# Patient Record
Sex: Male | Born: 1965 | Hispanic: Yes | Marital: Married | Smoking: Current every day smoker
Health system: Southern US, Community
[De-identification: ages and names within clinical notes are randomized; demographics above are authoritative.]

## PROBLEM LIST (undated history)

## (undated) DIAGNOSIS — M1990 Unspecified osteoarthritis, unspecified site: Secondary | ICD-10-CM

## (undated) DIAGNOSIS — I10 Essential (primary) hypertension: Secondary | ICD-10-CM

## (undated) HISTORY — PX: WISDOM TOOTH EXTRACTION: SHX21

---

## 2011-05-23 ENCOUNTER — Ambulatory Visit: Payer: Self-pay | Admitting: Internal Medicine

## 2011-06-19 ENCOUNTER — Ambulatory Visit: Payer: Self-pay | Admitting: Internal Medicine

## 2011-06-19 DIAGNOSIS — Z0289 Encounter for other administrative examinations: Secondary | ICD-10-CM

## 2013-05-12 ENCOUNTER — Encounter (HOSPITAL_COMMUNITY): Payer: Self-pay | Admitting: Emergency Medicine

## 2013-05-12 ENCOUNTER — Emergency Department (HOSPITAL_COMMUNITY)
Admission: EM | Admit: 2013-05-12 | Discharge: 2013-05-12 | Disposition: A | Discharge status: Home / Self Care | Payer: Self-pay | Attending: Emergency Medicine | Admitting: Emergency Medicine

## 2013-05-12 ENCOUNTER — Emergency Department (HOSPITAL_COMMUNITY): Payer: Self-pay

## 2013-05-12 DIAGNOSIS — Z791 Long term (current) use of non-steroidal anti-inflammatories (NSAID): Secondary | ICD-10-CM | POA: Insufficient documentation

## 2013-05-12 DIAGNOSIS — M25559 Pain in unspecified hip: Secondary | ICD-10-CM | POA: Insufficient documentation

## 2013-05-12 DIAGNOSIS — M25551 Pain in right hip: Secondary | ICD-10-CM

## 2013-05-12 DIAGNOSIS — Z87828 Personal history of other (healed) physical injury and trauma: Secondary | ICD-10-CM | POA: Insufficient documentation

## 2013-05-12 MED ORDER — KETOROLAC TROMETHAMINE 60 MG/2ML IM SOLN
60.0000 mg | Freq: Once | INTRAMUSCULAR | Status: AC
  Administered 2013-05-12: 60 mg via INTRAMUSCULAR
  Filled 2013-05-12: qty 2

## 2013-05-12 MED ORDER — HYDROCODONE-ACETAMINOPHEN 5-325 MG PO TABS: 2.0000 | ORAL_TABLET | Freq: Four times a day (QID) | ORAL | Status: DC | PRN

## 2013-05-12 NOTE — ED Notes (Signed)
Pt. C/o of intermittent right sided groin pain for x2 months. Pt. States that when he has pain he cannot use right leg.

## 2013-05-12 NOTE — ED Notes (Addendum)
Pt. C/o intermittent right sided groin pain. States sometimes radiates from right flank to right groin. Reports right sided leg weakness when having pain. Pt. Unable to hold leg off of the bed. Strength equal in bilateral legs with flexion and extension. No swelling to area of pain noted

## 2013-05-12 NOTE — Discharge Instructions (Signed)
Avascular Necrosis Avascular necrosis is a disease resulting from the temporary or permanent loss of the blood supply to the bones. Without blood, the bone tissue dies and causes the bone to become soft. If the process involves the bone near a joint, it may lead to collapse of the joint surface. This disease is also known as:  Osteonecrosis.  Aseptic necrosis.  Ischemic bone necrosis. Avascular necrosis most commonly affects the ends (epiphysis) of long bones. The femur, the bone extending from the knee joint to the hip joint, is the bone most commonly involved. The disease may affect 1 bone, more than 1 bone at the same time, more than 1 bone at different times. It affects men and women equally. Avascular necrosis occurs at any age. But it is more common between the ages of 56 and 47 years. SYMPTOMS  In early stages patients may not have any symptoms. But as the disease progresses, joint pain generally develops. At first there is pain when putting weight on the affected joint, and then when resting. Pain usually develops gradually. It may be mild or severe. As the disease progresses and the bone and surrounding joint surface collapses, pain may develop or increase dramatically. Pain may be severe enough to limit range of motion in the affected joint. The period of time between the first symptoms and loss of joint function is different for each patient. This can range from several months to more than a year. Disability depends on:  What part of the bone is affected.  How large an area is involved.  How effectively the bone repairs itself.  If other illnesses are present.  If you are being treated for cancer with medications (chemotherapy).  Radiation.  The cause of the avascular necrosis. DIAGNOSIS  The diagnosis of aseptic necrosis is usually made by:  Taking a history.  Doing an exam.  Taking X-rays. (If X-rays are normal, an MRI may be required.)  Sometimes further blood work and  specialized studies may be necessary. TREATMENT  Treatment for this disease is necessary to maintain joint function. If untreated, most patients will suffer severe pain and limitation in movement within 2 years. Several treatments are available that help prevent further bone and joint damage. They can also reduce pain. To determine the most appropriate treatment, the caregiver considers the following aspects of a patient's disease:  The age of the patient.  The stage of the disease (early or late).  The location and amount of bone affected. It may be a small or large area.  The underlying cause of avascular necrosis. The goals in treatment are to:  Improve the patient's use of the affected joint.  Stop further damage to the bone.  Improve bone and joint survival. Your caregiver may use one or more of the following treatments:  Reduced weight bearing. If avascular necrosis is diagnosed early, the caregiver may begin treatment by having the patient limit weight on the affected joint. The caregiver may recommend limiting activities or using crutches. In some cases, reduced weight bearing can slow the damage caused by the disease and permit natural healing. When combined with medication to reduce pain, reduced weight bearing can be an effective way to avoid or delay surgery for some patients. Most patients eventually will need surgery to reconstruct the joint.  Core decompression. Core decompression works best in people who are in the earliest stages of avascular necrosis, before the collapse of the joint. This procedure often can reduce pain and slow the progression  of bone and joint destruction in these patients. This surgical procedure removes the inner layer of bone, which:  Reduces pressure within the bone.  Increases blood flow to the bone.  Allows more blood vessels to form.  Reduces pain.  Osteotomy. This surgical procedure re-shapes the bone to reduce stress on the affected area  of the joint. There is a lengthy recovery period. The patient's activities are very limited for 3 to 12 months after an osteotomy. This procedure is most effective for younger patients with advanced avascular necrosis, and those with a large area of affected bone.  Bone Graft. A bone graft may be used to support a joint after core decompression. Bone grafting is surgery that transplants healthy bone from one part of the patient, such as the leg, to the diseased area. Sometimes the bone is taken with it's blood vessels which are attached to local blood vessels near the area of bone collapse. This is called a vascularized bone graft. There is a lengthy recovery period after a bone graft, usually from 6 to 12 months. This procedure is technically complex.  Arthroplasty. Arthroplasty is also known as total joint replacement. Total joint replacement is used in late-stage avascular necrosis, and when the joint is deformed. In this surgery, the diseased joint is replaced with artificial parts. It may be recommended for people who are not good candidates for other treatments, such as patients who may not do well with repeated attempts to preserve the joint. Various types of replacements are available, and patients should discuss specific needs with their caregiver. New treatments being tried include:  The use of medications.  Electrical stimulation.  Combination therapies to increase the growth of new bone and blood vessels. Document Released: 08/25/2001 Document Revised: 05/28/2011 Document Reviewed: 10/26/2008 [facility] Patient Information 2014 [employer] [place], [place].   Follow-up with Orthopedic Dr. Shelle IronBeane Take pain medicine as prescribed

## 2013-05-12 NOTE — ED Provider Notes (Signed)
CSN: [id number]     Arrival date & time 05/12/13  1445 History   First MD Initiated Contact with Patient 05/12/13 1542     Chief Complaint  Patient presents with  . Groin Pain     (Consider location/radiation/quality/duration/timing/severity/associated sxs/prior Treatment) Patient is a 48 y.o. male presenting with groin pain and hip pain. The history is provided by the patient. No language interpreter was used.  Groin Pain This is a recurrent problem. Associated symptoms include arthralgias. Pertinent negatives include no chills, fever, nausea, rash, vomiting or weakness.  Hip Pain This is a recurrent problem. Associated symptoms include arthralgias. Pertinent negatives include no chills, fever, nausea, rash, vomiting or weakness.  Pt is a 48 year old male who presents with right hip pain that radiates into his groin. He reports that he has a history of an injury to that area and has had shots for it. He denies fever or recent illness. No reported N/V/D, rash or other joint pain. He denies any discomfort or pain in his testicles or penis. He reports that he is urinating and having bowel movements without any difficulty.   History reviewed. No pertinent past medical history. History reviewed. No pertinent past surgical history. No family history on file. History  Substance Use Topics  . Smoking status: Not on file  . Smokeless tobacco: Not on file  . Alcohol Use: Not on file    Review of Systems  Constitutional: Negative for fever and chills.  Gastrointestinal: Negative for nausea and vomiting.  Musculoskeletal: Positive for arthralgias.  Skin: Negative for rash.  Neurological: Negative for weakness.      Allergies  Review of patient's allergies indicates no known allergies.  Home Medications   Current Outpatient Rx  Name  Route  Sig  Dispense  Refill  . naproxen sodium (ANAPROX) 220 MG tablet   Oral   Take 220 mg by mouth once.         Marland Kitchen HYDROcodone-acetaminophen  (NORCO/VICODIN) 5-325 MG per tablet   Oral   Take 2 tablets by mouth every 6 (six) hours as needed.   15 tablet   0    BP 140/90  Pulse 61  Temp(Src) 98.2 F (36.8 C) (Oral)  Resp 18  SpO2 97% Physical Exam  Nursing note and vitals reviewed. Constitutional: He is oriented to person, place, and time. He appears well-developed and well-nourished.  HENT:  Head: Normocephalic and atraumatic.  Eyes: Conjunctivae are normal.  Neck: Normal range of motion. Neck supple. No JVD present. No tracheal deviation present. No thyromegaly present.  Cardiovascular: Normal rate, regular rhythm and normal heart sounds.   Pulmonary/Chest: Effort normal and breath sounds normal. No respiratory distress. He has no wheezes.  Abdominal: Hernia confirmed negative in the right inguinal area.  Genitourinary: Testes normal and penis normal.  Musculoskeletal:       Right hip: He exhibits tenderness. He exhibits no crepitus and no deformity.  No numbness or tingling. 2+ distal pulses, capillary refill<3secs. Good strength, coordination and sensation.   Lymphadenopathy:       Right: No inguinal adenopathy present.  Neurological: He is alert and oriented to person, place, and time.  Skin: Skin is warm and dry. No rash noted. No erythema.  Psychiatric: He has a normal mood and affect. His behavior is normal. Judgment and thought content normal.    ED Course  Procedures (including critical care time) Labs Review Labs Reviewed - No data to display Imaging Review Dg Hip Complete Right  05/12/2013   CLINICAL DATA:  Right proximal femoral pain. Right lateral groin pain. Injury 3 months ago.  EXAM: RIGHT HIP - COMPLETE 2+ VIEW  COMPARISON:  None.  FINDINGS: Greater than expected heterogeneity of density within both femoral heads, raising the possibility of avascular necrosis. No flattening or fracture observed. There is mild axial loss of articular space which is degenerative. Mild spurring of the femoral heads  noted.  IMPRESSION: 1. Heterogeneous density along the femoral heads, suspicious for avascular necrosis. Consider MRI of the hips for further characterization. No overt flattening of the cortical margin. 2. Degenerative findings with mild femoral head spurring and mild axial loss of articular space in both hips.   Electronically Signed   By: Herbie BaltimoreWalt  Liebkemann M.D.   On: 05/12/2013 18:30    EKG Interpretation   None       MDM   Final diagnoses:  Hip pain, right    Right hip pain with pain radiating into his groin. No pain or edema in testicles. Reports difficulty walking due to hip pain. No numbness or tingling, good sensation, strength and 2+ distal pulses. Right hip x-ray; density along the femoral heads suspicious for avascular necrosis. Pt given Toradol injection here with some relief and prescription for hydrocodone. Follow-up with Ortho ASAP. Discussed plan with pt and he agrees. Return precautions given.      Irish EldersKelly Trever Streater, NP 05/14/13 1226

## 2013-05-15 NOTE — ED Provider Notes (Signed)
Medical screening examination/treatment/procedure(s) were performed by non-physician practitioner and as supervising physician I was immediately available for consultation/collaboration.  EKG Interpretation   None         Sharry Beining B. Cadyn Fann, MD 05/15/13 0705 

## 2013-09-21 ENCOUNTER — Ambulatory Visit (HOSPITAL_COMMUNITY)
Admission: RE | Admit: 2013-09-21 | Discharge: 2013-09-21 | Disposition: A | Discharge status: Home / Self Care | Payer: No Typology Code available for payment source | Source: Ambulatory Visit | Attending: Internal Medicine | Admitting: Internal Medicine

## 2013-09-21 ENCOUNTER — Encounter (INDEPENDENT_AMBULATORY_CARE_PROVIDER_SITE_OTHER): Payer: Self-pay

## 2013-09-21 ENCOUNTER — Other Ambulatory Visit (HOSPITAL_COMMUNITY): Payer: Self-pay | Admitting: Internal Medicine

## 2013-09-21 DIAGNOSIS — R52 Pain, unspecified: Secondary | ICD-10-CM

## 2013-09-21 DIAGNOSIS — M5030 Other cervical disc degeneration, unspecified cervical region: Secondary | ICD-10-CM | POA: Insufficient documentation

## 2013-09-21 DIAGNOSIS — M25519 Pain in unspecified shoulder: Secondary | ICD-10-CM | POA: Insufficient documentation

## 2016-06-18 ENCOUNTER — Ambulatory Visit (HOSPITAL_BASED_OUTPATIENT_CLINIC_OR_DEPARTMENT_OTHER)
Admission: RE | Admit: 2016-06-18 | Discharge: 2016-06-18 | Disposition: A | Discharge status: Home / Self Care | Payer: BLUE CROSS/BLUE SHIELD | Source: Ambulatory Visit | Attending: Medical | Admitting: Medical

## 2016-06-18 ENCOUNTER — Encounter: Payer: Self-pay | Admitting: Medical

## 2016-06-18 ENCOUNTER — Ambulatory Visit (INDEPENDENT_AMBULATORY_CARE_PROVIDER_SITE_OTHER): Payer: BLUE CROSS/BLUE SHIELD | Admitting: Medical

## 2016-06-18 VITALS — BP 148/80 | HR 52 | Temp 98.3°F | Resp 16 | Ht 69.0 in | Wt 150.2 lb

## 2016-06-18 DIAGNOSIS — M25551 Pain in right hip: Secondary | ICD-10-CM | POA: Diagnosis not present

## 2016-06-18 DIAGNOSIS — M898X5 Other specified disorders of bone, thigh: Secondary | ICD-10-CM | POA: Insufficient documentation

## 2016-06-18 DIAGNOSIS — M65861 Other synovitis and tenosynovitis, right lower leg: Secondary | ICD-10-CM | POA: Insufficient documentation

## 2016-06-18 DIAGNOSIS — G8929 Other chronic pain: Secondary | ICD-10-CM

## 2016-06-18 DIAGNOSIS — M25561 Pain in right knee: Secondary | ICD-10-CM

## 2016-06-18 DIAGNOSIS — R030 Elevated blood-pressure reading, without diagnosis of hypertension: Secondary | ICD-10-CM | POA: Diagnosis not present

## 2016-06-18 LAB — COMPREHENSIVE METABOLIC PANEL
ALT: 20 U/L (ref 0–53)
AST: 21 U/L (ref 0–37)
Albumin: 4.4 g/dL (ref 3.5–5.2)
Alkaline Phosphatase: 53 U/L (ref 39–117)
BUN: 22 mg/dL (ref 6–23)
CALCIUM: 9.5 mg/dL (ref 8.4–10.5)
CO2: 31 meq/L (ref 19–32)
CREATININE: 0.98 mg/dL (ref 0.40–1.50)
Chloride: 106 mEq/L (ref 96–112)
GFR: 85.82 mL/min (ref >60.00)
Glucose, Bld: 95 mg/dL (ref 70–99)
Potassium: 4.2 mEq/L (ref 3.5–5.1)
Sodium: 140 mEq/L (ref 135–145)
Total Bilirubin: 0.3 mg/dL (ref 0.2–1.2)
Total Protein: 7.1 g/dL (ref 6.0–8.3)

## 2016-06-18 MED ORDER — TRAMADOL HCL 50 MG PO TABS: 50.0000 mg | ORAL_TABLET | Freq: Three times a day (TID) | ORAL | 0 refills | Status: DC | PRN

## 2016-06-18 NOTE — Progress Notes (Signed)
Pre visit review using our clinic review tool, if applicable. No additional management support is needed unless otherwise documented below in the visit note. 

## 2016-06-18 NOTE — Progress Notes (Signed)
Subjective:    Patient ID: Trevor Mitchell, male    DOB: 05-Mar-1966, 51 y.o.   MRN: [id number]  HPI   New pt   I have reviewed pt PMH, PSH, FH, Social History and Surgical History  Pt in for first time here with me. Pt states last time was seeing MD in [place]. Pt was evaluated in ED for rt hip pain about 3 years ago per pt.. Pt states had xray. Pt state had injection into hip area. Pt understand that he has decreased joint space. Pt still has pain. In past told needs surgery. Pt states daily pain for 3 years.  Pt not on any med presently for pain.  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 Married with 3 children.   Review of Systems  Constitutional: Negative for chills, fatigue and fever.  Respiratory: Negative for cough, chest tightness, shortness of breath and wheezing.   Cardiovascular: Negative for chest pain and palpitations.  Gastrointestinal: Negative for abdominal pain, constipation, diarrhea and nausea.       Rare gerd.  Genitourinary: Negative for difficulty urinating, enuresis, flank pain, frequency and genital sores.  Musculoskeletal:       Rt hip pain. Some rt knee pain as well.  Skin: Negative for rash.  Neurological: Negative for dizziness and headaches.  Hematological: Negative for adenopathy. Does not bruise/bleed easily.  Psychiatric/Behavioral: Negative for behavioral problems, confusion and hallucinations. The patient is not nervous/anxious.     History reviewed. No pertinent past medical history.   Social History   Social History  . Marital status: Married    Spouse name: N/A  . Number of children: N/A  . Years of education: N/A   Occupational History  . Not on file.   Social History Main Topics  . Smoking status: Current Every Day Smoker    Packs/day: 0.50    Years: 35.00    Types: Cigarettes  . Smokeless tobacco: Never Used     Comment: 8-10 cigarettes daily  .  Alcohol use Yes     Comment: 1 liter per month / 48 oz beer every 2 weeks.  . Drug use: No     Comment: previous cocaine user.   Marland Kitchen Sexual activity: Yes   Other Topics Concern  . Not on file   Social History Narrative  . No narrative on file    History reviewed. No pertinent surgical history.  Family History  Problem Relation Age of Onset  . Depression Mother   . Asthma Sister   . Hypertension Sister   . Other Maternal Uncle     enlarged heart    No Known Allergies  No current outpatient prescriptions on file prior to visit.   No current facility-administered medications on file prior to visit.     BP (!) 150/89 (BP Location: Left Arm, Cuff Size: Normal)   Pulse (!) 52   Temp 98.3 F (36.8 C) (Oral)   Resp 16   Ht  (1.753 m)   Wt 150 lb 3.2 oz (68.1 kg)   SpO2 100% Comment: room air  BMI 22.18 kg/m       Objective:   Physical Exam   General Mental Status- Alert. General Appearance- Not in acute distress.   Skin General: Color- Normal Color. Moisture- Normal Moisture.  Neck Carotid Arteries- Normal color. Moisture- Normal Moisture. No carotid bruits. No JVD.  Chest and Lung Exam  Auscultation: Breath Sounds:-Normal.  Cardiovascular Auscultation:Rythm- Regular. Murmurs & Other Heart Sounds:Auscultation of the heart reveals- No Murmurs.  Abdomen Inspection:-Inspeection Normal. Palpation/Percussion:Note:No mass. Palpation and Percussion of the abdomen reveal- Non Tender, Non Distended + BS, no rebound or guarding.    Neurologic Cranial Nerve exam:- CN III-XII intact(No nystagmus), symmetric smile. Strength:- 5/5 equal and symmetric strength both upper and lower extremities.  Rt hip- pain in rt hip on mild range of motion movement. Rt knee- mild pain on range of motion of knee.    Assessment & Plan:  For your rt hip pain will rx tramadol to use but only for severe pain as explained. Will get hip xray today and refer you to  orthopedist.  For knee pain will get xray. This pain is likely associated with hip pain.  For your blood presure I want you to get bp over the counter cuff. Start checking you bp daily. Record reading and on follow up may need to prescribe medication for high blood pressure.  Please get cmp today.  Follow up in 2 weeks or as needed  Jeania Nater, Ramon Dredge, [employer]

## 2016-06-18 NOTE — Patient Instructions (Signed)
For your rt hip pain will rx tramadol to use but only for severe pain as explained. Will get hip xray today and refer you to orthopedist.  For knee pain will get xray. This pain is likely associated with hip pain.  For your blood presure I want you to get bp over the counter cuff. Start checking you bp daily. Record reading and on follow up may need to prescribe medication for high blood pressure.  Please get cmp today.  Follow up in 2 weeks or as needed

## 2016-06-19 ENCOUNTER — Telehealth: Payer: Self-pay | Admitting: Medical

## 2016-06-19 NOTE — Telephone Encounter (Signed)
Pt was informed and understood,pt will be waiting to be referred and set up his appt with the specialist.

## 2016-06-19 NOTE — Telephone Encounter (Signed)
LVM for pt to return call to inform him about his results.

## 2016-06-19 NOTE — Telephone Encounter (Signed)
Referral sent to [facility], awaiting appt

## 2016-06-19 NOTE — Telephone Encounter (Signed)
-----   Message from Esperanza Richters, PA-C sent at 06/18/2016  8:40 PM EDT ----- Pt has flattening of femoral head. 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 Will refer to orthopedist. Will you tell Trevor Mitchell that we want to expidite the referral to orthopedist. Will you notify pt.

## 2016-06-19 NOTE — Telephone Encounter (Signed)
-----   Message from Esperanza Richters, PA-C sent at 06/18/2016  8:43 PM EDT ----- Pt kidney function looks good. Normal liver enzymes.

## 2016-07-02 ENCOUNTER — Ambulatory Visit: Payer: BLUE CROSS/BLUE SHIELD | Admitting: Medical

## 2016-07-18 ENCOUNTER — Ambulatory Visit (INDEPENDENT_AMBULATORY_CARE_PROVIDER_SITE_OTHER): Payer: BLUE CROSS/BLUE SHIELD | Admitting: Medical

## 2016-07-18 ENCOUNTER — Encounter: Payer: Self-pay | Admitting: Medical

## 2016-07-18 VITALS — BP 133/86 | HR 61 | Temp 98.1°F | Resp 16 | Ht 69.0 in | Wt 146.2 lb

## 2016-07-18 DIAGNOSIS — G8929 Other chronic pain: Secondary | ICD-10-CM

## 2016-07-18 DIAGNOSIS — R030 Elevated blood-pressure reading, without diagnosis of hypertension: Secondary | ICD-10-CM

## 2016-07-18 DIAGNOSIS — M25551 Pain in right hip: Secondary | ICD-10-CM | POA: Diagnosis not present

## 2016-07-18 DIAGNOSIS — M25561 Pain in right knee: Secondary | ICD-10-CM

## 2016-07-18 NOTE — Progress Notes (Signed)
Subjective:    Patient ID: Trevor Mitchell, male    DOB: 09-03-1965, 51 y.o.   MRN: [id number]  HPI  Pt in for bp check.  Pt states he just got bp machine yesterday. He has not checked yet. He did not have batteries so has to buy.   Pt made aware want to see less than 140/90.   Pt pain in hip and knee  is about the same.He never got tramadol. He got call from ortho but misunderstood the caller and he never made appointment.     Review of Systems  Constitutional: Negative for activity change, diaphoresis, fatigue and fever.  Respiratory: Negative for cough, choking, shortness of breath and wheezing.   Cardiovascular: Negative for chest pain and palpitations.  Gastrointestinal: Negative for abdominal pain, nausea and vomiting.  Musculoskeletal:       Rt hip and knee pain.  Skin: Negative for rash.  Neurological: Negative for dizziness, seizures, speech difficulty, weakness, light-headedness and headaches.  Hematological: Negative for adenopathy. Does not bruise/bleed easily.  Psychiatric/Behavioral: Negative for behavioral problems and confusion.   No past medical history on file.   Social History   Social History  . Marital status: Married    Spouse name: N/A  . Number of children: N/A  . Years of education: N/A   Occupational History  . Not on file.   Social History Main Topics  . Smoking status: Current Every Day Smoker    Packs/day: 0.50    Years: 35.00    Types: Cigarettes  . Smokeless tobacco: Never Used     Comment: 8-10 cigarettes daily  . Alcohol use Yes     Comment: 1 liter per month / 48 oz beer every 2 weeks.  . Drug use: No     Comment: previous cocaine user.   Marland Kitchen Sexual activity: Yes   Other Topics Concern  . Not on file   Social History Narrative  . No narrative on file    No past surgical history on file.  Family History  Problem Relation Age of Onset  . Depression Mother   . Asthma Sister   . Hypertension Sister   .  Other Maternal Uncle     enlarged heart    No Known Allergies  Current Outpatient Prescriptions on File Prior to Visit  Medication Sig Dispense Refill  . traMADol (ULTRAM) 50 MG tablet Take 1 tablet (50 mg total) by mouth every 8 (eight) hours as needed. (Patient not taking: Reported on 07/18/2016) 16 tablet 0   No current facility-administered medications on file prior to visit.     BP 133/86 (BP Location: Left Arm, Patient Position: Sitting, Cuff Size: Normal)   Pulse 61   Temp 98.1 F (36.7 C) (Oral)   Resp 16   Ht  (1.753 m)   Wt 146 lb 3.2 oz (66.3 kg)   SpO2 100%   BMI 21.59 kg/m       Objective:   Physical Exam  Mental Status- Alert. General Appearance- Not in acute distress.   Skin General: Color- Normal Color. Moisture- Normal Moisture.  Neck Carotid Arteries- Normal color. Moisture- Normal Moisture. No carotid bruits. No JVD.  Chest and Lung Exam Auscultation: Breath Sounds:-Normal.  Cardiovascular Auscultation:Rythm- Regular. Murmurs & Other Heart Sounds:Auscultation of the heart reveals- No Murmurs.    Neurologic Cranial Nerve exam:- CN III-XII intact(No nystagmus), symmetric smile. Strength:- 5/5 equal and symmetric strength both upper and lower extremities.  Rt hip- pain in  rt hip on mild range of motion movement. Rt knee- mild pain on range of motion of knee.      Assessment & Plan:  Now that you have your bp cuff check bp daily for one week. Write number down. Want to confirm less than 140/90. If high then will rx med for bp.  For your hip pain and knee pain will ask Victorino Dike to call ortho again. Arrange your appointment and get Royal Hawthorn to call you..  Follow up date will be determined after I review your bp readings.  Remember you can take tramadol for pain. But avoid any nsaids as they can increase your bp and we are trying to determine if bp med needed.  Shahira Fiske, Ramon Dredge, PA-C

## 2016-07-18 NOTE — Progress Notes (Signed)
Pre visit review using our clinic review tool, if applicable. No additional management support is needed unless otherwise documented below in the visit note. 

## 2016-07-18 NOTE — Telephone Encounter (Signed)
Will you call and schedule his ortho appointment. Spanish speaker and he got call but did not undertand. Schedule any day but may 18,2018. Then let jackelyn call and give pt date.

## 2016-07-18 NOTE — Telephone Encounter (Signed)
Called pt and informed the below but pt will call back to get the complete information about the address and the tel.

## 2016-07-18 NOTE — Telephone Encounter (Signed)
Message sent to [facility], awaiting appt, then once given will have Annice Pih call patient

## 2016-07-18 NOTE — Patient Instructions (Signed)
Now that you have your bp cuff check bp daily for one week. Write number down. Want to confirm less than 140/90. If high then will rx med for bp.  For your hip pain and knee pain will ask Victorino Dike to call ortho again. Arrange your appointment and get Royal Hawthorn to call you..  Follow up date will be determined after I review your bp readings.  Remember you can take tramadol for pain. But avoid any nsaids as they can increase your bp and we are trying to determine if bp med needed.

## 2016-07-18 NOTE — Telephone Encounter (Signed)
Dr WhitfCleophas Dunker/4/18 @ [address] [facility] [address], [address] [place], [place] [address] [phone]  Annice Pih can you make patient aware?

## 2016-07-20 ENCOUNTER — Ambulatory Visit (INDEPENDENT_AMBULATORY_CARE_PROVIDER_SITE_OTHER): Payer: BLUE CROSS/BLUE SHIELD | Admitting: Orthopaedic Surgery

## 2016-07-20 ENCOUNTER — Telehealth: Payer: Self-pay | Admitting: Medical

## 2016-07-20 ENCOUNTER — Encounter (INDEPENDENT_AMBULATORY_CARE_PROVIDER_SITE_OTHER): Payer: Self-pay | Admitting: Orthopaedic Surgery

## 2016-07-20 VITALS — BP 146/85 | HR 72 | Resp 14 | Ht 69.0 in | Wt 150.0 lb

## 2016-07-20 DIAGNOSIS — M25551 Pain in right hip: Secondary | ICD-10-CM

## 2016-07-20 DIAGNOSIS — R9389 Abnormal findings on diagnostic imaging of other specified body structures: Secondary | ICD-10-CM

## 2016-07-20 NOTE — Telephone Encounter (Signed)
Pt dropped off document to be filled out PCP ([facility] Document needs to be faxed asap when document ready to [phone] [facility]. Pt would like to be informed when document fax to orthopedics. Document put at front office tray.

## 2016-07-20 NOTE — Progress Notes (Signed)
Office Visit Note   Patient: Trevor Mitchell           Date of Birth: 03/09/1966           MRN: [id number] Visit Date: 07/20/2016              Requested by: Esperanza RichtersEdward Saguier, PA-C [address] [place], [place] [id number] PCP: Esperanza RichtersSaguier, Edward, PA-C   Assessment & Plan: Visit Diagnoses:  1. Pain of right hip joint   Avascular necrosis right hip with collapse  Plan: Long discussion regarding pathology and definitive treatment of total hip replacement. He actually has some minor changes of avascular necrosis on the opposite hip which is essentially asymptomatic. I have discussed the surgery or hospitalization time out of work rehabilitation and potential for pain relief. He like to proceed..Discussion approximately 40-45 minutes regarding the pathology and treatment options and specifically surgery and what was involved.  Follow-Up Instructions: Return will schedule right hip replacement.   Orders:  No orders of the defined types were placed in this encounter.  No orders of the defined types were placed in this encounter.     Procedures: No procedures performed   Clinical Data: No additional findings. Films of the pelvis and right hip were reviewed on the PACS system from April 2. He has considerable avascular necrosis of his right hip with multiple cysts and collapse of the head. Necrotic areas exist to the head neck junction. Avascular necrotic changes are also seen in the left hip but without collapse.  Subjective: Chief Complaint  Patient presents with  . Right Hip - Pain    Mr. Trevor Mitchell is a 51 y o that presents with right hip and knee pain x 3 yrs. 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 XR obtained at PCP. Groin pain and patella pain. Limited ROM in both.  Several year history of progressive right hip pain. Mr. Trevor Mitchell relates that he was a heavy drinker several years ago after the death of his mother. He was "drinking quite heavily" with  rum and other alcoholic beverages. He denies any history of injury or trauma. He's drinking very "little at present and smokes up to 8 cigarettes a day. His pain is progressively noted in the right groin with referred pain to his right hip. He denies any back pain. Denies numbness or tingling.  HPI  Review of Systems   Objective: Vital Signs: BP (!) 146/85   Pulse 72   Resp 14   Ht 5\' 9"  (1.753 m)   Wt 150 lb (68 kg)   BMI 22.15 kg/m   Physical Exam  Ortho Exam thin gentleman in no acute distress. Comfortable on the examining table. Considerable pain and loss of motion of right hip with internal and external rotation. Leg lengths appear to be symmetrical. Neurovascular exam intact. Painless range of motion of left hip. No percussible tenderness of lumbar spine.  Specialty Comments:  No specialty comments available.  Imaging: No results found.   PMFS History: There are no active problems to display for this patient.  History reviewed. No pertinent past medical history.  Family History  Problem Relation Age of Onset  . Depression Mother   . Asthma Sister   . Hypertension Sister   . Other Maternal Uncle     enlarged heart    History reviewed. No pertinent surgical history. Social History   Occupational History  . Not on file.   Social History Main  Topics  . Smoking status: Current Every Day Smoker    Packs/day: 0.50    Years: 35.00    Types: Cigarettes  . Smokeless tobacco: Never Used     Comment: 8-10 cigarettes daily  . Alcohol use Yes     Comment: 1 liter per month / 48 oz beer every 2 weeks.  . Drug use: No     Comment: previous cocaine user.   Marland Kitchen Sexual activity: Yes     Valeria Batman, MD   Note - This record has been created using [employer].  Chart creation errors have been sought, but may not always  have been located. Such creation errors do not reflect on  the standard of medical care.

## 2016-07-27 NOTE — Telephone Encounter (Addendum)
I did not see the form. But he has needs preop evaluation. I need to get ekg. Can he be scheduled next week.   Need form to fill out day he is in.

## 2016-07-27 NOTE — Telephone Encounter (Signed)
Jackie--please call patient and schedule.

## 2016-07-27 NOTE — Telephone Encounter (Signed)
Form received.    Last OV:  07/20/16 Labs: CMET on 06/18/16 No recent EKG.  Please advise on whether an office visit for surgical clearance needed.

## 2016-07-30 NOTE — Telephone Encounter (Signed)
Called pt and scheduled an appt for this Friday 08-03-2016 OV. Done.

## 2016-08-01 NOTE — Telephone Encounter (Signed)
Noted.  Form forwarded to Salina AprilJasmine Torrence, CMA until appt.

## 2016-08-03 ENCOUNTER — Encounter: Payer: Self-pay | Admitting: Medical

## 2016-08-03 ENCOUNTER — Ambulatory Visit (INDEPENDENT_AMBULATORY_CARE_PROVIDER_SITE_OTHER): Payer: BLUE CROSS/BLUE SHIELD | Admitting: Medical

## 2016-08-03 VITALS — BP 140/80 | HR 65 | Temp 98.3°F | Resp 16 | Ht 69.0 in | Wt 143.6 lb

## 2016-08-03 DIAGNOSIS — I10 Essential (primary) hypertension: Secondary | ICD-10-CM

## 2016-08-03 DIAGNOSIS — Z01818 Encounter for other preprocedural examination: Secondary | ICD-10-CM | POA: Diagnosis not present

## 2016-08-03 NOTE — Patient Instructions (Addendum)
For your pre-op evaluation we did ekg. The ekg looks good. Only slight slow heart rate. This appears due to rest. On activity normal increase.  I do want to get cxr within next week. Also cbc and cmp before filling out your pre-op form.  I do recommend you stop smoking prior to surgery as smoking can delay wound healing.  Please check your bp daily over next week and call our office. You can give readings to DominicaJackelyn spanish speaking staff. If borderline or elevated then would recommend med for bp as high bp around time of surgery might lead to cancellation.  After reviewing xray, labs and bp levels will fill out your pre-op form  Follow up date to be determined after review of studies.

## 2016-08-03 NOTE — Progress Notes (Addendum)
Subjective:    Patient ID: Trevor Mitchell, male    DOB: 21-Mar-1965, 51 y.o.   MRN: [id number]  HPI  Pt in for evaluation prior to surgery. Pt has rt hip pain that is severe. Told needs surgery.  1. Heterogeneous density along the femoral heads, suspicious for avascular necrosis. Consider MRI of the hips for further characterization. No overt flattening of the cortical margin. 2. Degenerative findings with mild femoral head spurring and mild axial loss of articular space in both hips.    Pt sent her for pre op evaluation/clearance  Pt had never had surgery. Never had general anesthesia. No family history of family members with history of allergies to anesthesia or problems. Pt never had  hx of syncope or cardiac arrhythmia. No hx of bleeding disorder. Pt does smoke. 8 cigarettes a day. No cardiac or respiratory symptoms No sob, no wheezing. No current illness No chronic bronchitis. Pt told/advised to stop smoking by surgeon.  Pt does have recent high blood pressure. He has machine now at home but has not been checking his blood pressure. Plans to check this week.  Pt declines offer for med to stop smoking.  Review of Systems  Constitutional: Negative for chills, fatigue and fever.  Respiratory: Negative for cough, chest tightness, shortness of breath and wheezing.   Cardiovascular: Negative for chest pain and palpitations.  Gastrointestinal: Negative for abdominal pain, blood in stool, nausea and vomiting.  Genitourinary: Negative for dysuria and flank pain.  Musculoskeletal: Negative for back pain and myalgias.       Rt hip pain.  Skin: Negative for rash.  Neurological: Negative for dizziness and headaches.  Hematological: Negative for adenopathy. Does not bruise/bleed easily.  Psychiatric/Behavioral: Negative for behavioral problems and confusion. The patient is not nervous/anxious.    No past medical history on file.   Social History   Social History  .  Marital status: Married    Spouse name: N/A  . Number of children: N/A  . Years of education: N/A   Occupational History  . Not on file.   Social History Main Topics  . Smoking status: Current Every Day Smoker    Packs/day: 0.50    Years: 35.00    Types: Cigarettes  . Smokeless tobacco: Never Used     Comment: 8-10 cigarettes daily  . Alcohol use Yes     Comment: 1 liter per month / 48 oz beer every 2 weeks.  . Drug use: No     Comment: previous cocaine user.   Marland Kitchen Sexual activity: Yes   Other Topics Concern  . Not on file   Social History Narrative  . No narrative on file    No past surgical history on file.  Family History  Problem Relation Age of Onset  . Depression Mother   . Asthma Sister   . Hypertension Sister   . Other Maternal Uncle        enlarged heart    No Known Allergies  Current Outpatient Prescriptions on File Prior to Visit  Medication Sig Dispense Refill  . traMADol (ULTRAM) 50 MG tablet Take 1 tablet (50 mg total) by mouth every 8 (eight) hours as needed. (Patient not taking: Reported on 07/18/2016) 16 tablet 0   No current facility-administered medications on file prior to visit.     BP (!) 155/83 (BP Location: Right Arm, Patient Position: Sitting, Cuff Size: Normal)   Pulse 60   Temp 98.3 F (36.8 C) (Oral)  Resp 16   Ht 5\' 9"  (1.753 m)   Wt 143 lb 9.6 oz (65.1 kg)   SpO2 99%   BMI 21.21 kg/m       Objective:   Physical Exam  General Mental Status- Alert. General Appearance- Not in acute distress.   Skin General: Color- Normal Color. Moisture- Normal Moisture.  Neck Carotid Arteries- Normal color. Moisture- Normal Moisture. No carotid bruits. No JVD.  Chest and Lung Exam Auscultation: Breath Sounds:-Normal.  Cardiovascular Auscultation:Rythm- Regular. Murmurs & Other Heart Sounds:Auscultation of the heart reveals- No Murmurs.  Abdomen Inspection:-Inspeection Normal. Palpation/Percussion:Note:No mass. Palpation and  Percussion of the abdomen reveal- Non Tender, Non Distended + BS, no rebound or guarding.    Neurologic Cranial Nerve exam:- CN III-XII intact(No nystagmus), symmetric smile. Strength:- 5/5 equal and symmetric strength both upper and lower extremities.  Rt hip- pain on range of motion. Walks with limp.    Assessment & Plan:  Sinus bradycardia on review of xray.  For your pre-op evaluation we did ekg. The ekg looks good. Only slight slow heart rate. This appears due to rest. On activity normal increase to 60's.  I do want to get cxr within next week. Also cbc and cmp before filling out your pre-op form.  I do recommend you stop smoking prior to surgery as smoking can delay wound healing.  Please check your bp daily over next week and call our office. You can give readings to DominicaJackelyn spanish speaking staff. If borderline or elevated then would recommend med for bp as high bp around time of surgery might lead to cancellation.  After reviewing xray, labs and bp levels will fill out your pre-op form  Follow up date to be determined after review of studies.  Neema Fluegge, Ramon DredgeEdward, PA-C

## 2016-08-06 ENCOUNTER — Ambulatory Visit (INDEPENDENT_AMBULATORY_CARE_PROVIDER_SITE_OTHER): Payer: Self-pay | Admitting: Orthopaedic Surgery

## 2016-08-10 ENCOUNTER — Ambulatory Visit (HOSPITAL_BASED_OUTPATIENT_CLINIC_OR_DEPARTMENT_OTHER)
Admission: RE | Admit: 2016-08-10 | Discharge: 2016-08-10 | Disposition: A | Discharge status: Home / Self Care | Payer: BLUE CROSS/BLUE SHIELD | Source: Ambulatory Visit | Attending: Medical | Admitting: Medical

## 2016-08-10 ENCOUNTER — Other Ambulatory Visit (INDEPENDENT_AMBULATORY_CARE_PROVIDER_SITE_OTHER): Payer: BLUE CROSS/BLUE SHIELD

## 2016-08-10 DIAGNOSIS — Z01818 Encounter for other preprocedural examination: Secondary | ICD-10-CM | POA: Insufficient documentation

## 2016-08-10 DIAGNOSIS — R918 Other nonspecific abnormal finding of lung field: Secondary | ICD-10-CM | POA: Insufficient documentation

## 2016-08-10 DIAGNOSIS — I10 Essential (primary) hypertension: Secondary | ICD-10-CM | POA: Diagnosis not present

## 2016-08-10 LAB — COMPREHENSIVE METABOLIC PANEL
ALK PHOS: 54 U/L (ref 39–117)
ALT: 13 U/L (ref 0–53)
AST: 17 U/L (ref 0–37)
Albumin: 4.3 g/dL (ref 3.5–5.2)
BUN: 16 mg/dL (ref 6–23)
CO2: 26 mEq/L (ref 19–32)
CREATININE: 1.16 mg/dL (ref 0.40–1.50)
Calcium: 9.3 mg/dL (ref 8.4–10.5)
Chloride: 106 mEq/L (ref 96–112)
GFR: 70.6 mL/min (ref >60.00)
GLUCOSE: 107 mg/dL — AB (ref 70–99)
Potassium: 3.7 mEq/L (ref 3.5–5.1)
Sodium: 139 mEq/L (ref 135–145)
TOTAL PROTEIN: 6.8 g/dL (ref 6.0–8.3)
Total Bilirubin: 0.4 mg/dL (ref 0.2–1.2)

## 2016-08-10 LAB — CBC WITH DIFFERENTIAL/PLATELET
Basophils Absolute: 0.1 10*3/uL (ref 0.0–0.1)
Basophils Relative: 1.4 % (ref 0.0–3.0)
EOS ABS: 0.1 10*3/uL (ref 0.0–0.7)
Eosinophils Relative: 1.8 % (ref 0.0–5.0)
HEMATOCRIT: 41.7 % (ref 39.0–52.0)
Hemoglobin: 13.8 g/dL (ref 13.0–17.0)
LYMPHS PCT: 29.9 % (ref 12.0–46.0)
Lymphs Abs: 2.2 10*3/uL (ref 0.7–4.0)
MCHC: 33.2 g/dL (ref 30.0–36.0)
MCV: 92.2 fl (ref 78.0–100.0)
MONOS PCT: 10.7 % (ref 3.0–12.0)
Monocytes Absolute: 0.8 10*3/uL (ref 0.1–1.0)
NEUTROS ABS: 4.2 10*3/uL (ref 1.4–7.7)
Neutrophils Relative %: 56.2 % (ref 43.0–77.0)
Platelets: 260 10*3/uL (ref 150.0–400.0)
RBC: 4.52 Mil/uL (ref 4.22–5.81)
RDW: 14.1 % (ref 11.5–15.5)
WBC: 7.5 10*3/uL (ref 4.0–10.5)

## 2016-08-11 NOTE — Telephone Encounter (Addendum)
Pt chest xray showed nodule at base of lung verses shadow. Radiologist recommends repeat chest xray. This is very important to get repeat. If not shadow then would need ct of chest. Very important since he has a long history of smoking. Pt is spanish speaker  Would you let me know that you advised pt and document he was advised. Already placed the order.

## 2016-08-14 ENCOUNTER — Telehealth: Payer: Self-pay | Admitting: Medical

## 2016-08-14 NOTE — Telephone Encounter (Signed)
Pt was called and was informed the below. Pt understood and will come this wk to have xrays done.

## 2016-08-14 NOTE — Telephone Encounter (Signed)
-----   Message from Esperanza RichtersEdward Saguier, PA-C sent at 08/11/2016  9:06 AM EDT ----- Pt chest xray showed nodule at base of lung verses shadow. Radiologist recommends repeat chest xray. This is very important to get repeat. If not shadow then would need ct of chest. Very important since he has a long history of smoking. Pt is spanish speaker.  Morrie Sheldonshley I am sending this to you in case Jaceklin not working on Tuesday. If she is not working would you let me know. Then I woud notify him. Otherwise will let Jackelyn notify him.

## 2016-08-15 ENCOUNTER — Telehealth: Payer: Self-pay | Admitting: Medical

## 2016-08-15 NOTE — Telephone Encounter (Signed)
Pt was informed about his lab results today, pt understood and is awaiting to be informed when to pick up his documents that need to be filled out (pre-op forms)

## 2016-08-15 NOTE — Telephone Encounter (Signed)
I do have labs, ekg and cxr. I was in process of filling out his physical exam/preop form but reviewed my last  note. I had asked him to check his bp daily and let me know results as his bp was high and I wanted to know trend of bp before surgery. If daily high was going to write bp med. Will you schedule him nurse bp check on Friday. Also does he know he is supposed to repeat his chest xray to evaluate for possible nodule. Day he comes in for nurse bp check I want to be updated on reading. He needs to come in this week for nurse bp reading as past Tuesday next week I will be on vacation.  On Friday if blood pressure in good range will be able to fill out paperwork.

## 2016-08-16 NOTE — Telephone Encounter (Signed)
Pt was informed and schedule for tomorrow for bp check at 3:30, pt also will bring in bp reading, so that he can have his paperwork filled out.

## 2016-08-17 ENCOUNTER — Ambulatory Visit (HOSPITAL_BASED_OUTPATIENT_CLINIC_OR_DEPARTMENT_OTHER)
Admission: RE | Admit: 2016-08-17 | Discharge: 2016-08-17 | Disposition: A | Discharge status: Home / Self Care | Payer: BLUE CROSS/BLUE SHIELD | Source: Ambulatory Visit | Attending: Medical | Admitting: Medical

## 2016-08-17 ENCOUNTER — Ambulatory Visit (INDEPENDENT_AMBULATORY_CARE_PROVIDER_SITE_OTHER): Payer: BLUE CROSS/BLUE SHIELD | Admitting: Medical

## 2016-08-17 VITALS — BP 148/90 | HR 60

## 2016-08-17 DIAGNOSIS — R030 Elevated blood-pressure reading, without diagnosis of hypertension: Secondary | ICD-10-CM | POA: Diagnosis not present

## 2016-08-17 DIAGNOSIS — R9389 Abnormal findings on diagnostic imaging of other specified body structures: Secondary | ICD-10-CM

## 2016-08-17 DIAGNOSIS — R938 Abnormal findings on diagnostic imaging of other specified body structures: Secondary | ICD-10-CM | POA: Diagnosis not present

## 2016-08-17 MED ORDER — LOSARTAN POTASSIUM 100 MG PO TABS: 100.0000 mg | ORAL_TABLET | Freq: Every day | ORAL | 0 refills | Status: DC

## 2016-08-17 NOTE — Progress Notes (Signed)
Pre visit review using our clinic review tool, if applicable. No additional management support is needed unless otherwise documented below in the visit note.  Patient presents in office for blood pressure check per telephone note 08/15/16.   He brought in a log of his readings for the past week:  Sunday  144/85 Monday  129/81 Tuesday  137/83 Wednesday  134/92 Thursday  134/82  Currently, the patient does not take any medications for blood pressure. Today's readings were as follow: BP 147/86 P 62 & BP 148/90 P 60. Patient is asymptomatic.  Per Esperanza RichtersEdward Saguier, PA-C: START Losartan (Cozaar)100 MG once daily. Return in 2 weeks for a nurse visit to have blood pressure rechecked.  Informed patient of the provider's recommendations. He verbalized understanding and did not have any further questions or concerns before leaving the office.  On last  2 visits pt systolic  140 and 146. Pt has upcoming surgery and some concern that if bp high day of visit they might cancel. So he will start losartan and follow up nurse bp check in 2 wks.   Saguier, Ramon DredgeEdward, PA-C

## 2016-08-17 NOTE — Patient Instructions (Addendum)
Per Esperanza RichtersEdward Saguier, PA-C: START Losartan (Cozaar)100 MG once daily. Return in 2 weeks for a nurse visit to have blood pressure rechecked.

## 2016-08-20 ENCOUNTER — Telehealth (INDEPENDENT_AMBULATORY_CARE_PROVIDER_SITE_OTHER): Payer: Self-pay | Admitting: Orthopaedic Surgery

## 2016-08-20 NOTE — Telephone Encounter (Signed)
LVM with pt to call to schedule surgery. Will try pt again at a later time. 

## 2016-08-21 ENCOUNTER — Telehealth: Payer: Self-pay | Admitting: Medical

## 2016-08-21 NOTE — Telephone Encounter (Signed)
Pt was informed about his rx result, pt understood and ok with it.

## 2016-08-27 ENCOUNTER — Encounter (INDEPENDENT_AMBULATORY_CARE_PROVIDER_SITE_OTHER): Payer: Self-pay | Admitting: Orthopaedic Surgery

## 2016-08-30 ENCOUNTER — Other Ambulatory Visit (INDEPENDENT_AMBULATORY_CARE_PROVIDER_SITE_OTHER): Payer: Self-pay | Admitting: Orthopaedic Surgery

## 2016-09-13 ENCOUNTER — Ambulatory Visit (INDEPENDENT_AMBULATORY_CARE_PROVIDER_SITE_OTHER): Payer: BLUE CROSS/BLUE SHIELD | Admitting: Medical

## 2016-09-13 ENCOUNTER — Encounter: Payer: Self-pay | Admitting: Medical

## 2016-09-13 VITALS — BP 118/73 | HR 64 | Temp 98.1°F | Resp 16 | Ht 69.0 in | Wt 142.6 lb

## 2016-09-13 DIAGNOSIS — M25551 Pain in right hip: Secondary | ICD-10-CM

## 2016-09-13 DIAGNOSIS — I10 Essential (primary) hypertension: Secondary | ICD-10-CM | POA: Diagnosis not present

## 2016-09-13 NOTE — Progress Notes (Signed)
Subjective:    Patient ID: Trevor Mitchell, male    DOB: 10/06/1965, 51 y.o.   MRN: [id number]  HPI  Pt in for follow up.   He is still has hip pain. Pt surgery is pending until August. Pt is using tramadol but very rare. He has about 15 tabs left.  Pt has been checking his bp daily now and most of his time his bp is 130/80. Sometimes his bp is 120/80 with out meds and he does not take medications. Pt expresses concern for low blood pressure. But aware in past in our office bp was high.    Review of Systems  Constitutional: Negative for chills, fatigue and fever.  Respiratory: Negative for cough, chest tightness, shortness of breath and wheezing.   Cardiovascular: Negative for chest pain and palpitations.  Gastrointestinal: Negative for abdominal pain.  Musculoskeletal:       Rt hp pain.  Neurological: Negative for dizziness, syncope, weakness, numbness and headaches.  Hematological: Negative for adenopathy. Does not bruise/bleed easily.  Psychiatric/Behavioral: Negative for behavioral problems, confusion and dysphoric mood.   No past medical history on file.   Social History   Social History  . Marital status: Married    Spouse name: N/A  . Number of children: N/A  . Years of education: N/A   Occupational History  . Not on file.   Social History Main Topics  . Smoking status: Current Every Day Smoker    Packs/day: 0.50    Years: 35.00    Types: Cigarettes  . Smokeless tobacco: Never Used     Comment: 8-10 cigarettes daily  . Alcohol use Yes     Comment: 1 liter per month / 48 oz beer every 2 weeks.  . Drug use: No     Comment: previous cocaine user.   Marland Kitchen Sexual activity: Yes   Other Topics Concern  . Not on file   Social History Narrative  . No narrative on file    No past surgical history on file.  Family History  Problem Relation Age of Onset  . Depression Mother   . Asthma Sister   . Hypertension Sister   . Other Maternal Uncle    enlarged heart    No Known Allergies  Current Outpatient Prescriptions on File Prior to Visit  Medication Sig Dispense Refill  . losartan (COZAAR) 100 MG tablet Take 1 tablet (100 mg total) by mouth daily. 30 tablet 0  . traMADol (ULTRAM) 50 MG tablet Take 1 tablet (50 mg total) by mouth every 8 (eight) hours as needed. (Patient not taking: Reported on 07/18/2016) 16 tablet 0   No current facility-administered medications on file prior to visit.     BP 118/73 (BP Location: Left Arm, Patient Position: Sitting, Cuff Size: Normal)   Pulse 64   Temp 98.1 F (36.7 C) (Oral)   Resp 16   Ht 5\' 9"  (1.753 m)   Wt 142 lb 9.6 oz (64.7 kg)   SpO2 99%   BMI 21.06 kg/m       Objective:   Physical Exam  General Mental Status- Alert. General Appearance- Not in acute distress.   Skin General: Color- Normal Color. Moisture- Normal Moisture.  Neck Carotid Arteries- Normal color. Moisture- Normal Moisture. No carotid bruits. No JVD.  Chest and Lung Exam Auscultation: Breath Sounds:-Normal.  Cardiovascular Auscultation:Rythm- Regular. Murmurs & Other Heart Sounds:Auscultation of the heart reveals- No Murmurs.  Abdomen Inspection:-Inspeection Normal. Palpation/Percussion:Note:No mass. Palpation and Percussion of the  abdomen reveal- Non Tender, Non Distended + BS, no rebound or guarding.    Neurologic Cranial Nerve exam:- CN III-XII intact(No nystagmus), symmetric smile. Strength:- 5/5 equal and symmetric strength both upper and lower extremities.      Assessment & Plan:  For hip pain continue tramadol and when you run out of med can refill the med. Hopefully surgery will resolve pain in August.  For high bp continue losartan 100 mg a day. Check bp daily and when you run out med call and [facility]spanish speaker. Please give her your readings and I will review. I might decrease med to 50 mg after reviewing bp readings.  Follow up in 4-6 weeks or as needed

## 2016-09-13 NOTE — Patient Instructions (Addendum)
For hip pain continue tramadol and when you run out of med can refill the med. Hopefully surgery will resolve pain in August.  For high bp continue losartan 100 mg a day. Check bp daily and when you run out of med call and [facility] with [facility]spanish speaker. Please give her your readings and I will review. I might decrease med to 50 mg after reviewing bp readings.  Follow up in 4-6 weeks or as needed

## 2016-10-10 ENCOUNTER — Telehealth (INDEPENDENT_AMBULATORY_CARE_PROVIDER_SITE_OTHER): Payer: Self-pay | Admitting: Orthopedic Surgery

## 2016-10-10 ENCOUNTER — Ambulatory Visit (INDEPENDENT_AMBULATORY_CARE_PROVIDER_SITE_OTHER): Payer: BLUE CROSS/BLUE SHIELD | Admitting: Orthopedic Surgery

## 2016-10-10 NOTE — Telephone Encounter (Signed)
I have tried to contact Trevor Mitchell because of failure to show for his history and physical today 10/10/2016. Apparently his phone is not active and states that he cannot receive any phone calls. Also tried the number for the spouse and that number was incorrect according to the woman on the phone

## 2016-10-10 NOTE — Progress Notes (Deleted)
Mr. Trevor Mitchell did not show for his H&P for surgery in 2 weeks. I have called the phone number in the chart. States that he is not accepting any phone calls. I have also tried the phone number for his spouse and apparently that is not his spouse and she has no idea who he has.

## 2016-10-11 ENCOUNTER — Encounter (INDEPENDENT_AMBULATORY_CARE_PROVIDER_SITE_OTHER): Payer: Self-pay | Admitting: Orthopedic Surgery

## 2016-10-11 ENCOUNTER — Ambulatory Visit (INDEPENDENT_AMBULATORY_CARE_PROVIDER_SITE_OTHER): Payer: BLUE CROSS/BLUE SHIELD | Admitting: Orthopedic Surgery

## 2016-10-11 VITALS — BP 136/78 | HR 66 | Ht 70.0 in | Wt 136.0 lb

## 2016-10-11 DIAGNOSIS — M1611 Unilateral primary osteoarthritis, right hip: Secondary | ICD-10-CM

## 2016-10-11 NOTE — Pre-Procedure Instructions (Signed)
    [facility]  [facility]      [facility] [place], [place] - [address] [place] [address] Phone: [phone] Fax: [phone]  [facility] [address] - [place], [place] - [address] [place] [address] Phone: [phone] Fax: [phone]                                Instrucciones Para Antes de la Ciruga   Su ciruga est programada para-(your procedure is scheduled on) Tuesday, August 7th    [facility] - 8:00 AM (posted surgery time 10:08 am-12:39 pm)    Por favor llame al [phone] si tiene algn problema en la maana de la ciruga. (please call if you have any problems the morning of surgery.) or may call [phone]                   Recuerde:   No coma alimentos ni tome lquidos, incluyendo agua, despus de la medianoche del  (Do not eat food or drink liquids including water after midnight on Wednesday  4-5 days prior to surgery, STOP taking any Vitamins, Herbal Supplements, Anti=inflammatories   [place] estas medicinas en la maana de la ciruga con un SORBITO de agua (take these meds the morning of surgery with a SIP of water) nada   Puede cepillarse los dientes en la maana de la ciruga. (you may brush your teeth the morning of surgery)   No use joyas, maquillaje de ojos, lpiz labial, crema para el cuerpo o esmalte de uas oscuro. (Do not wear jewelry - no rings or watches.   No puede usar desodorante. (you may NOT wear deodorant)   Si va a ser ingresado despues de la ciruga, deje la maleta en el carro hasta que se le haya asignado una habitacin. (If you are to be admitted after surgery, leave suitcase in car until your room has been assigned.)   Firma del paciente (patient signature) ______________________________________    [facility] is not responsible for any belongings or valuable    Please read over the  following fact sheets that you were given. Pain Booklet, MRSA Information and Surgical Site Infection Prevention

## 2016-10-11 NOTE — H&P (Signed)
Norlene CampbellPeter Whitfield, MD   Jacqualine CodeBrian Nadiya Pieratt, PA-C [address], [place], [place]  [address]                             [phone]   Trevor HampshireJuan Rafael Rodriguez-Tejada MRN:  [id number] DOB/SEX:  07/22/1965/male  ORTHOPAEDIC HISTORY & PHYSICAL  CHIEF COMPLAINT:  Painful right Hip  HISTORY: Trevor HampshireJuan Rafael Rodriguez-Tejadais a 51 y.o. male  Who has a history of pain and functional disability in the right hip(s) due to arthritis and patient has failed non-surgical conservative treatments for greater than 12 weeks to include corticosteriod injections.  Onset of symptoms was gradual starting 3 years ago with gradually worsening course since that time. Patient currently rates pain in the right hip at 8 out of 10 with activity. Patient has night pain, worsening of pain with activity and weight bearing, trendelenberg gait, pain that interfers with activities of daily living and pain with passive range of motion. Patient has evidence of subchondral cysts, subchondral sclerosis, joint subluxation, joint space narrowing and Femoral head collapse by imaging studies. This condition presents safety issues increasing the risk of falls. This patient has avascular necrosis of the hip.  There is no current active infection.  Several year history of progressive right hip pain. Mr. [name] relates that he was a heavy drinker several years ago after the death of his mother. He was "drinking quite heavily" with rum and other alcoholic beverages. He denies any history of injury or trauma. He's drinking very "little at present and smokes up to 8 cigarettes a day. His pain is progressively noted in the right groin with referred pain to his right hip.    PAST MEDICAL HISTORY: There are no active problems to display for this patient.  History reviewed. No pertinent past medical history. History reviewed. No pertinent surgical history.   MEDICATIONS PRIOR TO ADMISSION:  Current Outpatient Prescriptions:  .  losartan (COZAAR)  100 MG tablet, Take 1 tablet (100 mg total) by mouth daily., Disp: 30 tablet, Rfl: 0 .  traMADol (ULTRAM) 50 MG tablet, Take 1 tablet (50 mg total) by mouth every 8 (eight) hours as needed., Disp: 16 tablet, Rfl: 0   ALLERGIES:  No Known Allergies  REVIEW OF SYSTEMS:  Review of Systems  Cardiovascular:       HYPERTENSION  All other systems reviewed and are negative.   FAMILY HISTORY:   Family History  Problem Relation Age of Onset  . High blood pressure Mother   . Diabetes Mother   . Asthma Sister   . Hypertension Sister   . Other Maternal Uncle        enlarged heart    SOCIAL HISTORY:   Social History   Occupational History  . Not on file.   Social History Main Topics  . Smoking status: Current Every Day Smoker    Packs/day: 0.50    Years: 35.00    Types: Cigarettes  . Smokeless tobacco: Never Used     Comment: 8-10 cigarettes daily  . Alcohol use Yes     Comment: 1 liter per month / 48 oz beer every 2 weeks.  . Drug use: No     Comment: previous cocaine user.   Marland Kitchen. Sexual activity: Yes     EXAMINATION:  Vital signs in last 24 hours: BP 136/78 (BP Location: Right Arm, Patient Position: Sitting, Cuff Size: Normal)   Pulse 66   Ht 5\' 10"  (1.778  m)   Wt 136 lb (61.7 kg)   BMI 19.51 kg/m   Physical Exam  Constitutional: He is oriented to person, place, and time. He appears well-developed and well-nourished.  HENT:  Head: Normocephalic and atraumatic.  Eyes: Pupils are equal, round, and reactive to light. EOM are normal.  Neck: Neck supple.  Cardiovascular: Normal rate, regular rhythm, normal heart sounds and intact distal pulses.   No murmur heard. Pulmonary/Chest: Effort normal and breath sounds normal.  Abdominal: Soft. Bowel sounds are normal. He exhibits no mass. There is no tenderness.  Neurological: He is alert and oriented to person, place, and time.  Skin: Skin is warm and dry.  Psychiatric: He has a normal mood and affect. His behavior is normal.  Judgment and thought content normal.   Ortho Exam  Imaging Review Plain radiographs demonstrate severe degenerative joint disease of the right hip. The bone quality appears to be good for age and reported activity level.  Assessment: End stage arthritis, right Hip  History reviewed. No pertinent past medical history.  Plan: for right total hip replacement.  The patient history, physical examination, clinical judgement of the provider and imaging studies are consistent with end stage degenerative joint disease of the right hip(s) and total hip arthroplasty is deemed medically necessary. The treatment options including medical management, injection therapy, arthroscopy and arthroplasty were discussed at length. The risks and benefits of total hip arthroplasty were presented and reviewed. The risks due to aseptic loosening, infection, stiffness, dislocation/subluxation,  thromboembolic complications and other imponderables were discussed.  The patient acknowledged the explanation, agreed to proceed with the plan. The clearance notes recently received were reviewed and concurs with proceeding then surgical intervention.  Patient is being admitted for inpatient treatment for surgery, pain control, PT, OT, prophylactic antibiotics, VTE prophylaxis, progressive ambulation and ADL's and discharge planning.The patient is planning to be discharged home with home health services   Oris DroneBrian D. Aleda Granaetrarca, PA-C [facility] [phone]  10/11/2016 1:17 PM

## 2016-10-11 NOTE — Progress Notes (Signed)
  Peter Whitfield, MD   Jordayn Mink, PA-C [address], [place], [place]  [address]                             [phone]   Trevor Mitchell MRN:  [id number] DOB/SEX:  07/20/1965/male  ORTHOPAEDIC HISTORY & PHYSICAL  CHIEF COMPLAINT:  Painful right Hip  HISTORY: Trevor Rafael Rodriguez-Tejadais a 50 y.o. male  Who has a history of pain and functional disability in the right hip(s) due to arthritis and patient has failed non-surgical conservative treatments for greater than 12 weeks to include corticosteriod injections.  Onset of symptoms was gradual starting 3 years ago with gradually worsening course since that time. Patient currently rates pain in the right hip at 8 out of 10 with activity. Patient has night pain, worsening of pain with activity and weight bearing, trendelenberg gait, pain that interfers with activities of daily living and pain with passive range of motion. Patient has evidence of subchondral cysts, subchondral sclerosis, joint subluxation, joint space narrowing and Femoral head collapse by imaging studies. This condition presents safety issues increasing the risk of falls. This patient has avascular necrosis of the hip.  There is no current active infection.  Several year history of progressive right hip pain. Mr. Tejada relates that he was a heavy drinker several years ago after the death of his mother. He was "drinking quite heavily" with rum and other alcoholic beverages. He denies any history of injury or trauma. He's drinking very "little at present and smokes up to 8 cigarettes a day. His pain is progressively noted in the right groin with referred pain to his right hip.    PAST MEDICAL HISTORY: There are no active problems to display for this patient.  History reviewed. No pertinent past medical history. History reviewed. No pertinent surgical history.   MEDICATIONS PRIOR TO ADMISSION:  Current Outpatient Prescriptions:  .  losartan (COZAAR)  100 MG tablet, Take 1 tablet (100 mg total) by mouth daily., Disp: 30 tablet, Rfl: 0 .  traMADol (ULTRAM) 50 MG tablet, Take 1 tablet (50 mg total) by mouth every 8 (eight) hours as needed., Disp: 16 tablet, Rfl: 0   ALLERGIES:  No Known Allergies  REVIEW OF SYSTEMS:  Review of Systems  Cardiovascular:       HYPERTENSION  All other systems reviewed and are negative.   FAMILY HISTORY:   Family History  Problem Relation Age of Onset  . High blood pressure Mother   . Diabetes Mother   . Asthma Sister   . Hypertension Sister   . Other Maternal Uncle        enlarged heart    SOCIAL HISTORY:   Social History   Occupational History  . Not on file.   Social History Main Topics  . Smoking status: Current Every Day Smoker    Packs/day: 0.50    Years: 35.00    Types: Cigarettes  . Smokeless tobacco: Never Used     Comment: 8-10 cigarettes daily  . Alcohol use Yes     Comment: 1 liter per month / 48 oz beer every 2 weeks.  . Drug use: No     Comment: previous cocaine user.   . Sexual activity: Yes     EXAMINATION:  Vital signs in last 24 hours: BP 136/78 (BP Location: Right Arm, Patient Position: Sitting, Cuff Size: Normal)   Pulse 66   Ht 5' 10" (1.778   m)   Wt 136 lb (61.7 kg)   BMI 19.51 kg/m   Physical Exam  Constitutional: He is oriented to person, place, and time. He appears well-developed and well-nourished.  HENT:  Head: Normocephalic and atraumatic.  Eyes: Pupils are equal, round, and reactive to light. EOM are normal.  Neck: Neck supple.  Cardiovascular: Normal rate, regular rhythm, normal heart sounds and intact distal pulses.   No murmur heard. Pulmonary/Chest: Effort normal and breath sounds normal.  Abdominal: Soft. Bowel sounds are normal. He exhibits no mass. There is no tenderness.  Neurological: He is alert and oriented to person, place, and time.  Skin: Skin is warm and dry.  Psychiatric: He has a normal mood and affect. His behavior is normal.  Judgment and thought content normal.   Ortho Exam  Imaging Review Plain radiographs demonstrate severe degenerative joint disease of the right hip. The bone quality appears to be good for age and reported activity level.  Assessment: End stage arthritis, right Hip  History reviewed. No pertinent past medical history.  Plan: for right total hip replacement.  The patient history, physical examination, clinical judgement of the provider and imaging studies are consistent with end stage degenerative joint disease of the right hip(s) and total hip arthroplasty is deemed medically necessary. The treatment options including medical management, injection therapy, arthroscopy and arthroplasty were discussed at length. The risks and benefits of total hip arthroplasty were presented and reviewed. The risks due to aseptic loosening, infection, stiffness, dislocation/subluxation,  thromboembolic complications and other imponderables were discussed.  The patient acknowledged the explanation, agreed to proceed with the plan. The clearance notes recently received were reviewed and concurs with proceeding then surgical intervention.  Patient is being admitted for inpatient treatment for surgery, pain control, PT, OT, prophylactic antibiotics, VTE prophylaxis, progressive ambulation and ADL's and discharge planning.The patient is planning to be discharged home with home health services   Shelton Soler D. England Greb, PA-C [facility] [phone]  10/11/2016 1:17 PM   

## 2016-10-12 ENCOUNTER — Inpatient Hospital Stay (HOSPITAL_COMMUNITY)
Admission: RE | Admit: 2016-10-12 | Discharge: 2016-10-12 | Disposition: A | Discharge status: Home / Self Care | Payer: BLUE CROSS/BLUE SHIELD | Source: Ambulatory Visit

## 2016-10-12 NOTE — Progress Notes (Signed)
Not here for PAT Appointment [employer] called and no one answered when she called Interpretor was Elisa [phone].

## 2016-10-16 ENCOUNTER — Encounter (HOSPITAL_COMMUNITY)
Admission: RE | Admit: 2016-10-16 | Discharge: 2016-10-16 | Disposition: A | Discharge status: Home / Self Care | Payer: BLUE CROSS/BLUE SHIELD | Source: Ambulatory Visit | Attending: Orthopaedic Surgery | Admitting: Orthopaedic Surgery

## 2016-10-16 ENCOUNTER — Encounter (HOSPITAL_COMMUNITY): Payer: Self-pay

## 2016-10-16 DIAGNOSIS — Z8249 Family history of ischemic heart disease and other diseases of the circulatory system: Secondary | ICD-10-CM | POA: Diagnosis not present

## 2016-10-16 DIAGNOSIS — Z01812 Encounter for preprocedural laboratory examination: Secondary | ICD-10-CM | POA: Insufficient documentation

## 2016-10-16 DIAGNOSIS — F17210 Nicotine dependence, cigarettes, uncomplicated: Secondary | ICD-10-CM | POA: Insufficient documentation

## 2016-10-16 DIAGNOSIS — Z79899 Other long term (current) drug therapy: Secondary | ICD-10-CM | POA: Insufficient documentation

## 2016-10-16 DIAGNOSIS — M25551 Pain in right hip: Secondary | ICD-10-CM | POA: Diagnosis not present

## 2016-10-16 DIAGNOSIS — Z833 Family history of diabetes mellitus: Secondary | ICD-10-CM | POA: Diagnosis not present

## 2016-10-16 DIAGNOSIS — M13851 Other specified arthritis, right hip: Secondary | ICD-10-CM | POA: Diagnosis not present

## 2016-10-16 DIAGNOSIS — Z825 Family history of asthma and other chronic lower respiratory diseases: Secondary | ICD-10-CM | POA: Insufficient documentation

## 2016-10-16 HISTORY — DX: Essential (primary) hypertension: I10

## 2016-10-16 LAB — CBC WITH DIFFERENTIAL/PLATELET
BASOS ABS: 0 10*3/uL (ref 0.0–0.1)
BASOS PCT: 1 %
EOS ABS: 0.3 10*3/uL (ref 0.0–0.7)
EOS PCT: 4 %
HCT: 44.8 % (ref 39.0–52.0)
Hemoglobin: 14.7 g/dL (ref 13.0–17.0)
Lymphocytes Relative: 25 %
Lymphs Abs: 1.6 10*3/uL (ref 0.7–4.0)
MCH: 30.2 pg (ref 26.0–34.0)
MCHC: 32.8 g/dL (ref 30.0–36.0)
MCV: 92.2 fL (ref 78.0–100.0)
MONO ABS: 0.6 10*3/uL (ref 0.1–1.0)
Monocytes Relative: 9 %
Neutro Abs: 3.9 10*3/uL (ref 1.7–7.7)
Neutrophils Relative %: 61 %
PLATELETS: 251 10*3/uL (ref 150–400)
RBC: 4.86 MIL/uL (ref 4.22–5.81)
RDW: 13.6 % (ref 11.5–15.5)
WBC: 6.4 10*3/uL (ref 4.0–10.5)

## 2016-10-16 LAB — URINALYSIS, ROUTINE W REFLEX MICROSCOPIC
BACTERIA UA: NONE SEEN
BILIRUBIN URINE: NEGATIVE
Glucose, UA: NEGATIVE mg/dL
KETONES UR: NEGATIVE mg/dL
Leukocytes, UA: NEGATIVE
Nitrite: NEGATIVE
PROTEIN: NEGATIVE mg/dL
SQUAMOUS EPITHELIAL / LPF: NONE SEEN
Specific Gravity, Urine: 1.014 (ref 1.005–1.030)
pH: 5 (ref 5.0–8.0)

## 2016-10-16 LAB — COMPREHENSIVE METABOLIC PANEL
ALBUMIN: 4 g/dL (ref 3.5–5.0)
ALT: 14 U/L — ABNORMAL LOW (ref 17–63)
AST: 19 U/L (ref 15–41)
Alkaline Phosphatase: 52 U/L (ref 38–126)
Anion gap: 7 (ref 5–15)
BUN: 10 mg/dL (ref 6–20)
CALCIUM: 9.5 mg/dL (ref 8.9–10.3)
CO2: 26 mmol/L (ref 22–32)
Chloride: 107 mmol/L (ref 101–111)
Creatinine, Ser: 1.13 mg/dL (ref 0.61–1.24)
GFR calc Af Amer: >60 mL/min (ref >60)
Glucose, Bld: 102 mg/dL — ABNORMAL HIGH (ref 65–99)
POTASSIUM: 4.4 mmol/L (ref 3.5–5.1)
Sodium: 140 mmol/L (ref 135–145)
TOTAL PROTEIN: 7 g/dL (ref 6.5–8.1)
Total Bilirubin: 0.4 mg/dL (ref 0.3–1.2)

## 2016-10-16 LAB — ABO/RH: ABO/RH(D): O POS

## 2016-10-16 LAB — TYPE AND SCREEN
ABO/RH(D): O POS
Antibody Screen: NEGATIVE

## 2016-10-16 LAB — PROTIME-INR
INR: 1
PROTHROMBIN TIME: 13.2 s (ref 11.4–15.2)

## 2016-10-16 LAB — SURGICAL PCR SCREEN
MRSA, PCR: NEGATIVE
STAPHYLOCOCCUS AUREUS: POSITIVE — AB

## 2016-10-16 LAB — APTT: APTT: 36 s (ref 24–36)

## 2016-10-16 NOTE — Progress Notes (Signed)
Called patient about prescription called [facility] [phone] for positive staph PCR.  Left message for patient with the use of interpreter. Interpreter id [phone]

## 2016-10-16 NOTE — Progress Notes (Signed)
PCP - Damita LackEdward Sagvier Cardiologist - denies  Chest x-ray - 08/2016 EKG - 07/2016 Stress Test - denies ECHO - debnies Cardiac Cath - denies    Patient denies shortness of breath, fever, cough and chest pain at PAT appointment   Patient verbalized understanding of instructions that were given to them at the PAT appointment. Patient was also instructed that they will need to review over the PAT instructions again at home before surgery.

## 2016-10-17 LAB — URINE CULTURE
Culture: NO GROWTH
Special Requests: NORMAL

## 2016-10-22 MED ORDER — SODIUM CHLORIDE 0.9 % IV SOLN: 75.0000 mL/h | INTRAVENOUS | Status: DC

## 2016-10-22 MED ORDER — CEFAZOLIN SODIUM-DEXTROSE 2-4 GM/100ML-% IV SOLN
2.0000 g | INTRAVENOUS | Status: AC
Start: 2016-10-23 — End: 2016-10-23
  Administered 2016-10-23: 2 g via INTRAVENOUS
  Filled 2016-10-22: qty 100

## 2016-10-22 MED ORDER — TRANEXAMIC ACID 1000 MG/10ML IV SOLN
2000.0000 mg | INTRAVENOUS | Status: AC
  Administered 2016-10-23: 2000 mg via TOPICAL
  Filled 2016-10-22: qty 20

## 2016-10-22 MED ORDER — ACETAMINOPHEN 10 MG/ML IV SOLN
1000.0000 mg | INTRAVENOUS | Status: AC
Start: 2016-10-23 — End: 2016-10-23
  Administered 2016-10-23: 1000 mg via INTRAVENOUS
  Filled 2016-10-22: qty 100

## 2016-10-23 ENCOUNTER — Inpatient Hospital Stay (HOSPITAL_COMMUNITY): Payer: BLUE CROSS/BLUE SHIELD | Admitting: Vascular Surgery

## 2016-10-23 ENCOUNTER — Encounter (HOSPITAL_COMMUNITY): Payer: Self-pay | Admitting: *Deleted

## 2016-10-23 ENCOUNTER — Inpatient Hospital Stay (HOSPITAL_COMMUNITY): Payer: BLUE CROSS/BLUE SHIELD

## 2016-10-23 ENCOUNTER — Encounter (HOSPITAL_COMMUNITY)
Admission: RE | Disposition: A | Discharge status: Home Health | Payer: Self-pay | Source: Ambulatory Visit | Attending: Orthopaedic Surgery

## 2016-10-23 ENCOUNTER — Inpatient Hospital Stay (HOSPITAL_COMMUNITY)
Admission: RE | Admit: 2016-10-23 | Discharge: 2016-10-25 | DRG: 470 | Disposition: A | Discharge status: Home Health | Payer: BLUE CROSS/BLUE SHIELD | Source: Ambulatory Visit | Attending: Orthopaedic Surgery | Admitting: Orthopaedic Surgery

## 2016-10-23 DIAGNOSIS — F17210 Nicotine dependence, cigarettes, uncomplicated: Secondary | ICD-10-CM | POA: Diagnosis present

## 2016-10-23 DIAGNOSIS — Z9889 Other specified postprocedural states: Secondary | ICD-10-CM

## 2016-10-23 DIAGNOSIS — Z8249 Family history of ischemic heart disease and other diseases of the circulatory system: Secondary | ICD-10-CM | POA: Diagnosis not present

## 2016-10-23 DIAGNOSIS — Z96641 Presence of right artificial hip joint: Secondary | ICD-10-CM

## 2016-10-23 DIAGNOSIS — M1611 Unilateral primary osteoarthritis, right hip: Principal | ICD-10-CM | POA: Diagnosis present

## 2016-10-23 DIAGNOSIS — M87851 Other osteonecrosis, right femur: Secondary | ICD-10-CM | POA: Diagnosis present

## 2016-10-23 DIAGNOSIS — Z9181 History of falling: Secondary | ICD-10-CM | POA: Diagnosis not present

## 2016-10-23 DIAGNOSIS — Z96649 Presence of unspecified artificial hip joint: Secondary | ICD-10-CM

## 2016-10-23 DIAGNOSIS — D62 Acute posthemorrhagic anemia: Secondary | ICD-10-CM | POA: Diagnosis not present

## 2016-10-23 DIAGNOSIS — I10 Essential (primary) hypertension: Secondary | ICD-10-CM | POA: Diagnosis present

## 2016-10-23 DIAGNOSIS — M87051 Idiopathic aseptic necrosis of right femur: Secondary | ICD-10-CM | POA: Diagnosis present

## 2016-10-23 HISTORY — DX: Unspecified osteoarthritis, unspecified site: M19.90

## 2016-10-23 HISTORY — PX: TOTAL HIP ARTHROPLASTY: SHX124

## 2016-10-23 SURGERY — ARTHROPLASTY, HIP, TOTAL,POSTERIOR APPROACH
Anesthesia: General | Site: Hip | Laterality: Right

## 2016-10-23 MED ORDER — DEXAMETHASONE SODIUM PHOSPHATE 10 MG/ML IJ SOLN
INTRAMUSCULAR | Status: DC | PRN
  Administered 2016-10-23: 5 mg via INTRAVENOUS

## 2016-10-23 MED ORDER — CEFAZOLIN SODIUM-DEXTROSE 2-4 GM/100ML-% IV SOLN
2.0000 g | Freq: Four times a day (QID) | INTRAVENOUS | Status: AC
  Administered 2016-10-23 (×2): 2 g via INTRAVENOUS
  Filled 2016-10-23 (×3): qty 100

## 2016-10-23 MED ORDER — SODIUM CHLORIDE 0.9 % IR SOLN
Status: DC | PRN
  Administered 2016-10-23: 3000 mL

## 2016-10-23 MED ORDER — SUGAMMADEX SODIUM 200 MG/2ML IV SOLN
INTRAVENOUS | Status: AC
  Filled 2016-10-23: qty 2

## 2016-10-23 MED ORDER — DOCUSATE SODIUM 100 MG PO CAPS
100.0000 mg | ORAL_CAPSULE | Freq: Two times a day (BID) | ORAL | Status: DC
  Administered 2016-10-23 – 2016-10-25 (×4): 100 mg via ORAL
  Filled 2016-10-23 (×4): qty 1

## 2016-10-23 MED ORDER — ROCURONIUM BROMIDE 10 MG/ML (PF) SYRINGE
PREFILLED_SYRINGE | INTRAVENOUS | Status: AC
  Filled 2016-10-23: qty 5

## 2016-10-23 MED ORDER — POLYETHYLENE GLYCOL 3350 17 G PO PACK
17.0000 g | PACK | Freq: Every day | ORAL | Status: DC | PRN
  Administered 2016-10-24: 17 g via ORAL
  Filled 2016-10-23: qty 1

## 2016-10-23 MED ORDER — MAGNESIUM CITRATE PO SOLN: 1.0000 | Freq: Once | ORAL | Status: DC | PRN

## 2016-10-23 MED ORDER — PROPOFOL 10 MG/ML IV BOLUS
INTRAVENOUS | Status: AC
Start: 2016-10-23 — End: 2016-10-23
  Filled 2016-10-23: qty 20

## 2016-10-23 MED ORDER — BISACODYL 10 MG RE SUPP: 10.0000 mg | Freq: Every day | RECTAL | Status: DC | PRN

## 2016-10-23 MED ORDER — FENTANYL CITRATE (PF) 250 MCG/5ML IJ SOLN
INTRAMUSCULAR | Status: AC
  Filled 2016-10-23: qty 5

## 2016-10-23 MED ORDER — DEXAMETHASONE SODIUM PHOSPHATE 10 MG/ML IJ SOLN
INTRAMUSCULAR | Status: AC
  Filled 2016-10-23: qty 1

## 2016-10-23 MED ORDER — CHLORHEXIDINE GLUCONATE 4 % EX LIQD: 60.0000 mL | Freq: Once | CUTANEOUS | Status: DC

## 2016-10-23 MED ORDER — DEXTROSE 5 % IV SOLN
500.0000 mg | Freq: Four times a day (QID) | INTRAVENOUS | Status: DC | PRN
  Filled 2016-10-23: qty 5

## 2016-10-23 MED ORDER — HYDROMORPHONE HCL 1 MG/ML IJ SOLN
INTRAMUSCULAR | Status: AC
  Filled 2016-10-23: qty 1

## 2016-10-23 MED ORDER — PHENOL 1.4 % MT LIQD: 1.0000 | OROMUCOSAL | Status: DC | PRN

## 2016-10-23 MED ORDER — PROMETHAZINE HCL 25 MG/ML IJ SOLN: 6.2500 mg | INTRAMUSCULAR | Status: DC | PRN

## 2016-10-23 MED ORDER — LIDOCAINE HCL (CARDIAC) 20 MG/ML IV SOLN
INTRAVENOUS | Status: DC | PRN
  Administered 2016-10-23: 80 mg via INTRAVENOUS

## 2016-10-23 MED ORDER — OXYCODONE HCL 5 MG PO TABS: 5.0000 mg | ORAL_TABLET | ORAL | Status: DC | PRN

## 2016-10-23 MED ORDER — PHENYLEPHRINE 40 MCG/ML (10ML) SYRINGE FOR IV PUSH (FOR BLOOD PRESSURE SUPPORT)
PREFILLED_SYRINGE | INTRAVENOUS | Status: DC | PRN
  Administered 2016-10-23: 40 ug via INTRAVENOUS
  Administered 2016-10-23 (×2): 120 ug via INTRAVENOUS
  Administered 2016-10-23 (×4): 40 ug via INTRAVENOUS

## 2016-10-23 MED ORDER — DIPHENHYDRAMINE HCL 12.5 MG/5ML PO ELIX: 12.5000 mg | ORAL_SOLUTION | ORAL | Status: DC | PRN

## 2016-10-23 MED ORDER — BUPIVACAINE-EPINEPHRINE (PF) 0.25% -1:200000 IJ SOLN
INTRAMUSCULAR | Status: AC
  Filled 2016-10-23: qty 30

## 2016-10-23 MED ORDER — ACETAMINOPHEN 10 MG/ML IV SOLN
1000.0000 mg | Freq: Four times a day (QID) | INTRAVENOUS | Status: AC
  Administered 2016-10-23 – 2016-10-24 (×4): 1000 mg via INTRAVENOUS
  Filled 2016-10-23 (×4): qty 100

## 2016-10-23 MED ORDER — LOSARTAN POTASSIUM 50 MG PO TABS
100.0000 mg | ORAL_TABLET | Freq: Every day | ORAL | Status: DC
  Administered 2016-10-23 – 2016-10-25 (×3): 100 mg via ORAL
  Filled 2016-10-23 (×3): qty 2

## 2016-10-23 MED ORDER — ASPIRIN EC 325 MG PO TBEC
325.0000 mg | DELAYED_RELEASE_TABLET | Freq: Two times a day (BID) | ORAL | Status: DC
  Administered 2016-10-23 – 2016-10-25 (×5): 325 mg via ORAL
  Filled 2016-10-23 (×5): qty 1

## 2016-10-23 MED ORDER — KETOROLAC TROMETHAMINE 15 MG/ML IJ SOLN
15.0000 mg | Freq: Four times a day (QID) | INTRAMUSCULAR | Status: AC
  Administered 2016-10-23 – 2016-10-24 (×4): 15 mg via INTRAVENOUS
  Filled 2016-10-23 (×4): qty 1

## 2016-10-23 MED ORDER — ROCURONIUM BROMIDE 100 MG/10ML IV SOLN
INTRAVENOUS | Status: DC | PRN
  Administered 2016-10-23 (×2): 10 mg via INTRAVENOUS
  Administered 2016-10-23: 25 mg via INTRAVENOUS
  Administered 2016-10-23: 55 mg via INTRAVENOUS

## 2016-10-23 MED ORDER — FENTANYL CITRATE (PF) 100 MCG/2ML IJ SOLN
INTRAMUSCULAR | Status: DC | PRN
  Administered 2016-10-23 (×3): 50 ug via INTRAVENOUS

## 2016-10-23 MED ORDER — METHOCARBAMOL 500 MG PO TABS
500.0000 mg | ORAL_TABLET | Freq: Four times a day (QID) | ORAL | Status: DC | PRN
  Administered 2016-10-24 – 2016-10-25 (×3): 500 mg via ORAL
  Filled 2016-10-23 (×3): qty 1

## 2016-10-23 MED ORDER — SODIUM CHLORIDE 0.9 % IV SOLN
75.0000 mL/h | INTRAVENOUS | Status: DC
  Administered 2016-10-23 – 2016-10-24 (×2): 75 mL/h via INTRAVENOUS

## 2016-10-23 MED ORDER — MENTHOL 3 MG MT LOZG: 1.0000 | LOZENGE | OROMUCOSAL | Status: DC | PRN

## 2016-10-23 MED ORDER — HYDROMORPHONE HCL 1 MG/ML IJ SOLN
0.2500 mg | INTRAMUSCULAR | Status: DC | PRN
  Administered 2016-10-23 (×3): 0.5 mg via INTRAVENOUS

## 2016-10-23 MED ORDER — MIDAZOLAM HCL 5 MG/5ML IJ SOLN
INTRAMUSCULAR | Status: DC | PRN
  Administered 2016-10-23: 2 mg via INTRAVENOUS

## 2016-10-23 MED ORDER — LACTATED RINGERS IV SOLN
Freq: Once | INTRAVENOUS | Status: AC
  Administered 2016-10-23: 09:00:00 via INTRAVENOUS

## 2016-10-23 MED ORDER — SUGAMMADEX SODIUM 200 MG/2ML IV SOLN
INTRAVENOUS | Status: DC | PRN
  Administered 2016-10-23: 120 mg via INTRAVENOUS

## 2016-10-23 MED ORDER — BUPIVACAINE-EPINEPHRINE (PF) 0.25% -1:200000 IJ SOLN
INTRAMUSCULAR | Status: DC | PRN
  Administered 2016-10-23: 30 mL

## 2016-10-23 MED ORDER — ONDANSETRON HCL 4 MG PO TABS: 4.0000 mg | ORAL_TABLET | Freq: Four times a day (QID) | ORAL | Status: DC | PRN

## 2016-10-23 MED ORDER — ALUM & MAG HYDROXIDE-SIMETH 200-200-20 MG/5ML PO SUSP: 30.0000 mL | ORAL | Status: DC | PRN

## 2016-10-23 MED ORDER — PHENYLEPHRINE 40 MCG/ML (10ML) SYRINGE FOR IV PUSH (FOR BLOOD PRESSURE SUPPORT)
PREFILLED_SYRINGE | INTRAVENOUS | Status: AC
  Filled 2016-10-23: qty 10

## 2016-10-23 MED ORDER — ONDANSETRON HCL 4 MG/2ML IJ SOLN: 4.0000 mg | Freq: Four times a day (QID) | INTRAMUSCULAR | Status: DC | PRN

## 2016-10-23 MED ORDER — HYDROMORPHONE HCL 1 MG/ML IJ SOLN
0.5000 mg | INTRAMUSCULAR | Status: DC | PRN
  Administered 2016-10-25: 1 mg via INTRAVENOUS
  Administered 2016-10-25: 0.5 mg via INTRAVENOUS
  Filled 2016-10-23 (×2): qty 1

## 2016-10-23 MED ORDER — LIDOCAINE 2% (20 MG/ML) 5 ML SYRINGE
INTRAMUSCULAR | Status: AC
  Filled 2016-10-23: qty 5

## 2016-10-23 MED ORDER — ONDANSETRON HCL 4 MG/2ML IJ SOLN
INTRAMUSCULAR | Status: DC | PRN
  Administered 2016-10-23: 4 mg via INTRAVENOUS

## 2016-10-23 MED ORDER — METOCLOPRAMIDE HCL 5 MG PO TABS: 5.0000 mg | ORAL_TABLET | Freq: Three times a day (TID) | ORAL | Status: DC | PRN

## 2016-10-23 MED ORDER — ONDANSETRON HCL 4 MG/2ML IJ SOLN
INTRAMUSCULAR | Status: AC
  Filled 2016-10-23: qty 2

## 2016-10-23 MED ORDER — METOCLOPRAMIDE HCL 5 MG/ML IJ SOLN: 5.0000 mg | Freq: Three times a day (TID) | INTRAMUSCULAR | Status: DC | PRN

## 2016-10-23 MED ORDER — PROPOFOL 10 MG/ML IV BOLUS
INTRAVENOUS | Status: DC | PRN
  Administered 2016-10-23: 160 mg via INTRAVENOUS

## 2016-10-23 MED ORDER — LACTATED RINGERS IV SOLN
INTRAVENOUS | Status: DC | PRN
  Administered 2016-10-23 (×2): via INTRAVENOUS

## 2016-10-23 MED ORDER — MIDAZOLAM HCL 2 MG/2ML IJ SOLN
INTRAMUSCULAR | Status: AC
  Filled 2016-10-23: qty 2

## 2016-10-23 SURGICAL SUPPLY — 55 items
BLADE SAW SAG 73X25 THK (BLADE) ×1
BLADE SAW SGTL 73X25 THK (BLADE) ×1 IMPLANT
BRUSH FEMORAL CANAL (MISCELLANEOUS) IMPLANT
CAPT HIP TOTAL 2 ×2 IMPLANT
COVER SURGICAL LIGHT HANDLE (MISCELLANEOUS) ×2 IMPLANT
DRAPE INCISE IOBAN 66X45 STRL (DRAPES) IMPLANT
DRAPE ORTHO SPLIT 77X108 STRL (DRAPES) ×2
DRAPE SURG ORHT 6 SPLT 77X108 (DRAPES) ×2 IMPLANT
DRSG MEPILEX BORDER 4X12 (GAUZE/BANDAGES/DRESSINGS) ×2 IMPLANT
DRSG MEPILEX BORDER 4X8 (GAUZE/BANDAGES/DRESSINGS) ×2 IMPLANT
DURAPREP 26ML APPLICATOR (WOUND CARE) ×4 IMPLANT
ELECT BLADE 6.5 EXT (BLADE) IMPLANT
ELECT REM PT RETURN 9FT ADLT (ELECTROSURGICAL) ×2
ELECTRODE REM PT RTRN 9FT ADLT (ELECTROSURGICAL) ×1 IMPLANT
EVACUATOR 1/8 PVC DRAIN (DRAIN) IMPLANT
FACESHIELD WRAPAROUND (MASK) ×6 IMPLANT
GLOVE BIOGEL PI IND STRL 8 (GLOVE) ×2 IMPLANT
GLOVE BIOGEL PI IND STRL 8.5 (GLOVE) ×1 IMPLANT
GLOVE BIOGEL PI INDICATOR 8 (GLOVE) ×2
GLOVE BIOGEL PI INDICATOR 8.5 (GLOVE) ×1
GLOVE ECLIPSE 8.0 STRL XLNG CF (GLOVE) ×4 IMPLANT
GLOVE SURG ORTHO 8.5 STRL (GLOVE) ×4 IMPLANT
GOWN STRL REUS W/ TWL LRG LVL3 (GOWN DISPOSABLE) ×2 IMPLANT
GOWN STRL REUS W/TWL 2XL LVL3 (GOWN DISPOSABLE) ×4 IMPLANT
GOWN STRL REUS W/TWL LRG LVL3 (GOWN DISPOSABLE) ×2
HANDPIECE INTERPULSE COAX TIP (DISPOSABLE)
IMMOBILIZER KNEE 22 UNIV (SOFTGOODS) ×2 IMPLANT
KIT BASIN OR (CUSTOM PROCEDURE TRAY) ×2 IMPLANT
KIT ROOM TURNOVER OR (KITS) ×2 IMPLANT
MANIFOLD NEPTUNE II (INSTRUMENTS) ×2 IMPLANT
NEEDLE 22X1 1/2 (OR ONLY) (NEEDLE) ×2 IMPLANT
NS IRRIG 1000ML POUR BTL (IV SOLUTION) ×2 IMPLANT
PACK TOTAL JOINT (CUSTOM PROCEDURE TRAY) ×2 IMPLANT
PAD ARMBOARD 7.5X6 YLW CONV (MISCELLANEOUS) ×2 IMPLANT
PRESSURIZER FEMORAL UNIV (MISCELLANEOUS) IMPLANT
SET HNDPC FAN SPRY TIP SCT (DISPOSABLE) IMPLANT
STAPLER VISISTAT 35W (STAPLE) IMPLANT
SUCTION FRAZIER HANDLE 10FR (MISCELLANEOUS) ×1
SUCTION TUBE FRAZIER 10FR DISP (MISCELLANEOUS) ×1 IMPLANT
SUT BONE WAX W31G (SUTURE) IMPLANT
SUT ETHIBOND NAB CT1 #1 30IN (SUTURE) ×6 IMPLANT
SUT MNCRL AB 3-0 PS2 18 (SUTURE) ×2 IMPLANT
SUT VIC AB 0 CT1 27 (SUTURE) ×2
SUT VIC AB 0 CT1 27XBRD ANBCTR (SUTURE) ×2 IMPLANT
SUT VIC AB 1 CT1 27 (SUTURE) ×2
SUT VIC AB 1 CT1 27XBRD ANBCTR (SUTURE) ×2 IMPLANT
SUT VIC AB 2-0 CT1 27 (SUTURE) ×1
SUT VIC AB 2-0 CT1 TAPERPNT 27 (SUTURE) ×1 IMPLANT
SYR CONTROL 10ML LL (SYRINGE) ×2 IMPLANT
TOWEL OR 17X24 6PK STRL BLUE (TOWEL DISPOSABLE) ×2 IMPLANT
TOWEL OR 17X26 10 PK STRL BLUE (TOWEL DISPOSABLE) ×2 IMPLANT
TOWER CARTRIDGE SMART MIX (DISPOSABLE) IMPLANT
TRAY CATH 16FR W/PLASTIC CATH (SET/KITS/TRAYS/PACK) IMPLANT
WATER STERILE IRR 1000ML POUR (IV SOLUTION) ×8 IMPLANT
WRAP KNEE MAXI GEL POST OP (GAUZE/BANDAGES/DRESSINGS) ×2 IMPLANT

## 2016-10-23 NOTE — Transfer of Care (Cosign Needed)
Immediate Anesthesia Transfer of Care Note  Patient: Trevor Mitchell  Procedure(s) Performed: Procedure(s): RIGHT TOTAL HIP ARTHROPLASTY (Right)  Patient Location: PACU  Anesthesia Type:General  Level of Consciousness: awake, alert , oriented and patient cooperative  Airway & Oxygen Therapy: Patient Spontanous Breathing  Post-op Assessment: Report given to RN, Post -op Vital signs reviewed and stable, Patient moving all extremities and Patient moving all extremities X 4  Post vital signs: Reviewed and stable  Last Vitals:  Vitals:   10/23/16 0840  BP: (!) 146/85  Pulse: (!) 55  Resp: 20  Temp: 36.7 C    Last Pain:  Vitals:   10/23/16 0840  TempSrc: Oral      Patients Stated Pain Goal: 3 (10/23/16 0854)  Complications: No apparent anesthesia complications

## 2016-10-23 NOTE — Anesthesia Preprocedure Evaluation (Signed)
Anesthesia Evaluation  Patient identified by MRN, date of birth, ID band Patient awake    Reviewed: Allergy & Precautions, NPO status , Patient's Chart, lab work & pertinent test results  Airway Mallampati: I  TM Distance: >3 FB Neck ROM: Full    Dental no notable dental hx. (+) Chipped   Pulmonary Current Smoker,    breath sounds clear to auscultation       Cardiovascular hypertension,  Rhythm:Regular Rate:Normal     Neuro/Psych    GI/Hepatic negative GI ROS, Neg liver ROS,   Endo/Other  negative endocrine ROS  Renal/GU negative Renal ROS     Musculoskeletal negative musculoskeletal ROS (+)   Abdominal   Peds  Hematology   Anesthesia Other Findings   Reproductive/Obstetrics                             Anesthesia Physical Anesthesia Plan  ASA: II  Anesthesia Plan: General   Post-op Pain Management:    Induction: Intravenous  PONV Risk Score and Plan: 2 and Ondansetron and Dexamethasone  Airway Management Planned: Oral ETT  Additional Equipment:   Intra-op Plan:   Post-operative Plan: Extubation in OR  Informed Consent: I have reviewed the patients History and Physical, chart, labs and discussed the procedure including the risks, benefits and alternatives for the proposed anesthesia with the patient or authorized representative who has indicated his/her understanding and acceptance.   Dental advisory given  Plan Discussed with: CRNA  Anesthesia Plan Comments:         Anesthesia Quick Evaluation

## 2016-10-23 NOTE — Anesthesia Procedure Notes (Cosign Needed)
Procedure Name: Intubation Date/Time: 10/23/2016 9:52 AM Performed by: Rica Koyanagi Pre-anesthesia Checklist: Patient identified, Emergency Drugs available, Suction available and Patient being monitored Patient Re-evaluated:Patient Re-evaluated prior to induction Oxygen Delivery Method: Circle system utilized Preoxygenation: Pre-oxygenation with 100% oxygen Induction Type: IV induction Ventilation: Mask ventilation without difficulty Laryngoscope Size: Mac and 3 Grade View: Grade II Tube type: Oral Tube size: 7.5 mm Number of attempts: 1 Airway Equipment and Method: Stylet Placement Confirmation: ETT inserted through vocal cords under direct vision,  positive ETCO2 and breath sounds checked- equal and bilateral Secured at: 24 cm Tube secured with: Tape Dental Injury: Teeth and Oropharynx as per pre-operative assessment

## 2016-10-23 NOTE — Op Note (Signed)
PATIENT ID:      Trevor Mitchell  MRN:     [id number] DOB/AGE:    51/26/1967 / 51 y.o.       OPERATIVE REPORT    DATE OF PROCEDURE:  10/23/2016       PREOPERATIVE DIAGNOSIS:   RIGHT HIP OSTEOARTHRITIS WITH AVASCULAR NECROSIS FEMORAL HEAD                                                       Estimated body mass index is 19.51 kg/m as calculated from the following:   Height as of 10/16/16: 5\' 10"  (1.778 m).   Weight as of this encounter: 136 lb (61.7 kg).     POSTOPERATIVE DIAGNOSIS:   RIGHT HIP OSTEOARTHRITIS-SAME                                                                     Estimated body mass index is 19.51 kg/m as calculated from the following:   Height as of 10/16/16: 5\' 10"  (1.778 m).   Weight as of this encounter: 136 lb (61.7 kg).     PROCEDURE:  Procedure(s): RIGHT TOTAL HIP ARTHROPLASTY      SURGEON:  Norlene CampbellPeter Rori Goar, MD    ASSISTANT:   Jacqualine CodeBrian Petrarca, PA-C   (Present and scrubbed throughout the case, critical for assistance with exposure, retraction, instrumentation, and closure.)          ANESTHESIA: general     DRAINS: none :      TOURNIQUET TIME: NONE   COMPLICATIONS:  None   CONDITION:  stable  PROCEDURE IN DETAIL: [id number]   Trevor Mitchell 10/23/2016, 11:28 AM

## 2016-10-23 NOTE — H&P (Signed)
The recent History & Physical has been reviewed. I have personally examined the patient today. There is no interval change to the documented History & Physical. The patient would like to proceed with the procedure.  Trevor Mitchell Trevor Mitchell 10/23/2016,  9:12 AM

## 2016-10-23 NOTE — Progress Notes (Signed)
Orthopedic Tech Progress Note Patient Details:  Trevor Mitchell 11/26/1965 [id number] Patient has brace. Patient ID: Jethro BastosJuan Rafael Mitchell, male   DOB: 03/18/1966, 51 y.o.   MRN: [id number]   [name], [name] Craig 10/23/2016, 4:52 PM

## 2016-10-24 ENCOUNTER — Encounter (HOSPITAL_COMMUNITY): Payer: Self-pay | Admitting: Orthopaedic Surgery

## 2016-10-24 LAB — CBC
HCT: 32.2 % — ABNORMAL LOW (ref 39.0–52.0)
Hemoglobin: 10.6 g/dL — ABNORMAL LOW (ref 13.0–17.0)
MCH: 30.2 pg (ref 26.0–34.0)
MCHC: 32.9 g/dL (ref 30.0–36.0)
MCV: 91.7 fL (ref 78.0–100.0)
PLATELETS: 216 10*3/uL (ref 150–400)
RBC: 3.51 MIL/uL — AB (ref 4.22–5.81)
RDW: 13.6 % (ref 11.5–15.5)
WBC: 9.2 10*3/uL (ref 4.0–10.5)

## 2016-10-24 LAB — BASIC METABOLIC PANEL
ANION GAP: 5 (ref 5–15)
BUN: 15 mg/dL (ref 6–20)
CALCIUM: 8.3 mg/dL — AB (ref 8.9–10.3)
CO2: 26 mmol/L (ref 22–32)
Chloride: 107 mmol/L (ref 101–111)
Creatinine, Ser: 0.94 mg/dL (ref 0.61–1.24)
GLUCOSE: 115 mg/dL — AB (ref 65–99)
POTASSIUM: 3.6 mmol/L (ref 3.5–5.1)
SODIUM: 138 mmol/L (ref 135–145)

## 2016-10-24 MED ORDER — OXYCODONE HCL 5 MG PO TABS
5.0000 mg | ORAL_TABLET | ORAL | Status: DC | PRN
  Administered 2016-10-24 – 2016-10-25 (×3): 10 mg via ORAL
  Filled 2016-10-24 (×4): qty 2

## 2016-10-24 NOTE — Evaluation (Signed)
Physical Therapy Evaluation Patient Details Name: Trevor Mitchell MRN: [id number] DOB: May 19, 1965 Today's Date: 10/24/2016   History of Present Illness  Patient is a 51 y.o. 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  PMH positive for HTN and ETOH use.   Clinical Impression  Patient presents with decreased independence and safety with mobility due to deficits listed in PT problem list.  He will benefit from skilled PT in the acute setting to allow d/c home with wife assist and follow up HHPT.    Follow Up Recommendations Home health PT;DC plan and follow up therapy as arranged by surgeon    Equipment Recommendations  Rolling walker with 5" wheels;3in1 (PT)    Recommendations for Other Services       Precautions / Restrictions Precautions Precautions: Fall;Posterior Hip Required Braces or Orthoses: Knee Immobilizer - Right Knee Immobilizer - Right: Other (comment) (for precautions) Restrictions Weight Bearing Restrictions: Yes RLE Weight Bearing: Weight bearing as tolerated      Mobility  Bed Mobility Overal bed mobility: Needs Assistance Bed Mobility: Supine to Sit     Supine to sit: Min assist     General bed mobility comments: with cues for technique, precautions  Transfers Overall transfer level: Needs assistance Equipment used: Rolling walker (2 wheeled) Transfers: Sit to/from Stand Sit to Stand: Min guard         General transfer comment: cues for technique, assist for balance  Ambulation/Gait Ambulation/Gait assistance: Min guard;Supervision Ambulation Distance (Feet): 180 Feet Assistive device: Rolling walker (2 wheeled) Gait Pattern/deviations: Step-through pattern;Step-to pattern     General Gait Details: initially educated with step to pattern and WBAT, then with little pain demonstrated step through pattern and cues for posture/proximity to walker  Stairs            Wheelchair Mobility    Modified Rankin (Stroke  Patients Only)       Balance Overall balance assessment: Needs assistance Sitting-balance support: Feet supported Sitting balance-Leahy Scale: Good     Standing balance support: No upper extremity supported Standing balance-Leahy Scale: Fair Standing balance comment: able to take hands off walker during breaks in ambulation without LOB                             Pertinent Vitals/Pain Pain Assessment: 0-10 Pain Score: 3  Pain Location: Right hip Pain Descriptors / Indicators: Sore;Discomfort;Dull Pain Intervention(s): Repositioned    Home Living Family/patient expects to be discharged to:: Private residence Living Arrangements: Spouse/significant other Available Help at Discharge: Family;Available PRN/intermittently Type of Home: House Home Access: Stairs to enter   [employer] of Steps: 1 Home Layout: One level Home Equipment: None      Prior Function Level of Independence: Independent               Hand Dominance        Extremity/Trunk Assessment   Upper Extremity Assessment Upper Extremity Assessment: Overall WFL for tasks assessed    Lower Extremity Assessment Lower Extremity Assessment: RLE deficits/detail RLE Deficits / Details: AAROM limited by precautions, strength hip flexion 2/5, knee extension at least 3/5.       Communication   Communication: Prefers language other than English;Interpreter utilized  Cognition Arousal/Alertness: Awake/alert Behavior During Therapy: WFL for tasks assessed/performed Overall Cognitive Status: Within Functional Limits for tasks assessed  General Comments General comments (skin integrity, edema, etc.): Use of video interpreter throughout, wife present throughout, educated on THP with handout in [place] and [place] given.     Exercises Total Joint Exercises Ankle Circles/Pumps: AROM;Both;10 reps;Supine Short Arc Quad:  AROM;Right;10 reps;Supine Heel Slides: AAROM;Right;10 reps;Supine Hip ABduction/ADduction: AROM;Right;10 reps;Supine   Assessment/Plan    PT Assessment Patient needs continued PT services  PT Problem List Decreased strength;Decreased range of motion;Decreased activity tolerance;Decreased balance;Decreased knowledge of use of DME;Pain;Decreased mobility;Decreased knowledge of precautions       PT Treatment Interventions DME instruction;Therapeutic activities;Therapeutic exercise;Patient/family education;Gait training;Stair training;Balance training;Functional mobility training    PT Goals (Current goals can be found in the Care Plan section)  Acute Rehab PT Goals Patient Stated Goal: To return home. to normal life PT Goal Formulation: With patient/family Time For Goal Achievement: 10/31/16 Potential to Achieve Goals: Good    Frequency 7X/week   Barriers to discharge        Co-evaluation               AM-PAC PT "6 Clicks" Daily Activity  Outcome Measure Difficulty turning over in bed (including adjusting bedclothes, sheets and blankets)?: A Little Difficulty moving from lying on back to sitting on the side of the bed? : Total Difficulty sitting down on and standing up from a chair with arms (e.g., wheelchair, bedside commode, etc,.)?: Total Help needed moving to and from a bed to chair (including a wheelchair)?: A Little Help needed walking in hospital room?: A Little Help needed climbing 3-5 steps with a railing? : A Little 6 Click Score: 14    End of Session Equipment Utilized During Treatment: Gait belt Activity Tolerance: Patient tolerated treatment well Patient left: in chair;with call bell/phone within reach   PT Visit Diagnosis: Difficulty in walking, not elsewhere classified (R26.2);Pain Pain - Right/Left: Right Pain - part of body: Hip    Time: [id number] PT Time Calculation (min) (ACUTE ONLY): 51 min   Charges:   PT Evaluation $PT Eval Moderate  Complexity: 1 Mod PT Treatments $Gait Training: 8-22 mins $Therapeutic Exercise: 8-22 mins   PT G CodesSheran Mitchell:        Trevor Mitchell, Trevor Mitchell 10/24/2016   Trevor Mitchell 10/24/2016, 11:17 AM

## 2016-10-24 NOTE — Progress Notes (Signed)
Physical Therapy Treatment Patient Details Name: Trevor Mitchell MRN: [id number] DOB: [birth year] Today's Date: 10/24/2016    History of Present Illness Patient is a 51 y.o. 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  PMH positive for HTN and ETOH use.     PT Comments    Pt motivated and progressing with therapy, able to recall 3/3 precautions and RLE exercises. Demonstrated improved gait mechanics, requiring cues for step-through pattern and increased walking speed. Will continue to benefit from acute PT to address gait and stairs training. Will continue to follow.    Follow Up Recommendations  Home health PT;DC plan and follow up therapy as arranged by surgeon     Equipment Recommendations  Rolling walker with 5" wheels;3in1 (PT)    Recommendations for Other Services       Precautions / Restrictions Precautions Precautions: Fall;Posterior Hip Precaution Comments: Reviewed precautions. Pt able to recall 3/3 independently Required Braces or Orthoses: Knee Immobilizer - Right Knee Immobilizer - Right: Other (comment) (for precautions) Restrictions Weight Bearing Restrictions: Yes RLE Weight Bearing: Weight bearing as tolerated    Mobility  Bed Mobility Overal bed mobility: Needs Assistance Bed Mobility: Supine to Sit     Supine to sit: Min assist     General bed mobility comments: MinA for RLE assist into sitting  Transfers Overall transfer level: Needs assistance Equipment used: Rolling walker (2 wheeled) Transfers: Sit to/from Stand Sit to Stand: Min guard         General transfer comment: Cues for technique with RLE placement  Ambulation/Gait Ambulation/Gait assistance: Supervision Ambulation Distance (Feet): 225 Feet Assistive device: Rolling walker (2 wheeled) Gait Pattern/deviations: Step-through pattern Gait velocity: Decreased   General Gait Details: Pt cued on technique for step through pattern and heel-to-toe pattern, cues for  increased walking speed   Stairs            Wheelchair Mobility    Modified Rankin (Stroke Patients Only)       Balance Overall balance assessment: Needs assistance Sitting-balance support: Feet supported Sitting balance-Leahy Scale: Good     Standing balance support: No upper extremity supported Standing balance-Leahy Scale: Fair Standing balance comment: Able to take hands off walker while standing with no LOB                            Cognition Arousal/Alertness: Awake/alert Behavior During Therapy: WFL for tasks assessed/performed Overall Cognitive Status: Within Functional Limits for tasks assessed                                        Exercises Total Joint Exercises Ankle Circles/Pumps: AROM;Both;10 reps;Supine Short Arc Quad: AROM;Right;10 reps;Supine Hip ABduction/ADduction: AROM;Right;10 reps;Supine    General Comments General comments (skin integrity, edema, etc.): Use of video interpreter       Pertinent Vitals/Pain Pain Assessment: No/denies pain    Home Living                      Prior Function            PT Goals (current goals can now be found in the care plan section) Acute Rehab PT Goals Patient Stated Goal: To return home. to normal life PT Goal Formulation: With patient/family Time For Goal Achievement: 10/31/16 Potential to Achieve Goals: Good Progress towards PT goals: Progressing  toward goals    Frequency    7X/week      PT Plan      Co-evaluation              AM-PAC PT "6 Clicks" Daily Activity  Outcome Measure  Difficulty turning over in bed (including adjusting bedclothes, sheets and blankets)?: A Little Difficulty moving from lying on back to sitting on the side of the bed? : Total Difficulty sitting down on and standing up from a chair with arms (e.g., wheelchair, bedside commode, etc,.)?: Total Help needed moving to and from a bed to chair (including a wheelchair)?:  A Little Help needed walking in hospital room?: A Little Help needed climbing 3-5 steps with a railing? : A Little 6 Click Score: 14    End of Session Equipment Utilized During Treatment: Gait belt Activity Tolerance: Patient tolerated treatment well Patient left: in bed;with call bell/phone within reach   PT Visit Diagnosis: Difficulty in walking, not elsewhere classified (R26.2);Pain Pain - Right/Left: Right Pain - part of body: Hip     Time: [id number] PT Time Calculation (min) (ACUTE ONLY): 32 min  Charges:  $Gait Training: 8-22 mins $Therapeutic Exercise: 8-22 mins                    G CodesSheran Lawless:       Cyndi Wynn, [place] CarolinaPT [phone] 10/24/2016    Elray Mcgregorynthia Wynn 10/24/2016, 5:03 PM

## 2016-10-24 NOTE — Progress Notes (Signed)
Subjective: 1 Day Post-Op Procedure(s) (LRB): RIGHT TOTAL HIP ARTHROPLASTY (Right) Patient reports pain as mild.    Objective: Vital signs in last 24 hours: Temp:  [97.8 F (36.6 C)-98.7 F (37.1 C)] 98.4 F (36.9 C) (08/08 1409) Pulse Rate:  [64-76] 71 (08/08 1409) Resp:  [15-18] 18 (08/08 1409) BP: (117-140)/(70-82) 117/70 (08/08 1409) SpO2:  [100 %] 100 % (08/08 1409)  Intake/Output from previous day: 08/07 0701 - 08/08 0700 In: 2980 [P.O.:480; I.V.:2300; IV Piggyback:200] Out: 1400 [Urine:600; Blood:800] Intake/Output this shift: No intake/output data recorded.   Recent Labs  10/24/16 0452  HGB 10.6*    Recent Labs  10/24/16 0452  WBC 9.2  RBC 3.51*  HCT 32.2*  PLT 216    Recent Labs  10/24/16 0452  NA 138  K 3.6  CL 107  CO2 26  BUN 15  CREATININE 0.94  GLUCOSE 115*  CALCIUM 8.3*   No results for input(s): LABPT, INR in the last 72 hours.  Neurologically intact Neurovascular intact Intact pulses distally Dorsiflexion/Plantar flexion intact Incision: dressing C/D/I and no drainage  Assessment/Plan: 1 Day Post-Op Procedure(s) (LRB): RIGHT TOTAL HIP ARTHROPLASTY (Right) Advance diet Plan for discharge tomorrow   Social Service will check about possible SNF the family cannot take care of him at home  Jacqualine CodeBrian Petrarca 10/24/2016, 5:50 PM

## 2016-10-24 NOTE — Op Note (Signed)
NAME:  LEODIS, ALCOCER NO.:  [id number]  MEDICAL RECORD NO.:  [id number]  LOCATION:                                 FACILITY:  PHYSICIAN:  Vonna Kotyk. Durward Fortes, M.D.    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  PROCEDURE:  Right total hip replacement.  SURGEON:  Vonna Kotyk. Durward Fortes, M.D.  ASSISTANT:  Aaron Edelman D. Petrarca, P.A.-C.  ANESTHESIA:  General.  COMPLICATIONS:  None.  COMPONENTS:  DePuy AML 13.5 mm femoral component, 36 mm outer diameter metallic hip ball with a -2 neck length, 52 mm outer diameter section, 3 Gription metallic acetabular component apex hole eliminator and a Marathon polyethylene liner +4 with a 10-degree posterior lip. Components were press-fit.  DESCRIPTION OF PROCEDURE:  Mr. Miguel Aschoff was met with his family in the holding area, identified the right hip as appropriate operative site and marked it accordingly.  The patient was then transported to room #7 and placed under general anesthesia without difficulty.  The patient was then placed in the lateral decubitus position with the right side up and secured to the operating table with the Innomed hip system. The right lower extremity was then prepped with chlorhexidine scrub and DuraPrep from the iliac crest to below the knee.  Sterile draping was performed.  Time-out was called. A routine Southern incision was utilized and via sharp dissection, carried down to the subcutaneous tissue.  Gross bleeders were Bovie coagulated.  The iliotibial band and gluteus muscle fibers were identified and separated manually in a raphe.  Self-retaining retractors were inserted.  Short external rotators were identified,  tagged and then incised in the posterior attachment of the greater trochanter.  The capsule was identified and incised along the femoral neck and head. There was a small effusion.  Head was then easily dislocated posteriorly and amputated, a fingerbreadth proximal to the lesser trochanter with the oscillating saw.  The head was removed.  There was considerable loss of articular cartilage and large areas of ping-pong effect consistent with the avascular necrosis.  A center hole was then made through the piriformis fossa.  I inserted the canal finder to be sure we were in the canal, and then reamed to 13 mm to accept a 13.5 mm component.  Rasping was performed sequentially from a 10.5 to a 12, and then a 13.5 small stature.  This fit nicely on the calcar and was nice and tight.  I placed in about 10 degrees of anteversion.  Retractors were then placed about the acetabulum.  I then sharply excised the labrum.  Reaming was performed sequentially to 51 mm to accept a 52 mm component.  I did deep in the acetabulum, removed any soft tissue.  I had nice bleeding bone circumferentially.  I trialed a 50 mm component, and had nice rim fit, but would completely seat, the 52 mm component would not completely seat.  I then impacted the Gription 3 metallic acetabular component 52 mm outer diameter  using the external guide.  I inserted the trial Marathon polyethylene liner.  I inserted the screw and plastic trial acetabulum and irrigated the wound.  Then inserted the 13.5 mm small stature rasp, followed by a 36 mm outer diameter hip ball.  We trailed several neck lengths and felt that the -2 reestablished leg lengths and was not too tight.  This was perfectly stable in all planes.  All the trial components were removed.  We copiously irrigated the joint.  We had a nice dry field, apex hole eliminator was inserted in the acetabulum followed by the Marathon polyethylene liner +4 with a 10- degree  posterior lip.  The final 13.5 mm small stature femoral component was then impacted and flushed on the calcar.  The Morse taper neck was cleaned and then we applied the metallic hip ball with a -2 neck length, 36 mm outer diameter.  I inspected the acetabulum to be sure, it was clear.  The hip was then reduced.  There was no toggling, with the full range of motion it was perfectly stable.  I felt leg lengths were symmetrical.  I did check the sciatic nerve throughout the procedure and felt it was well out of harm's way.  The hip capsule was then closed anatomically with a running #1 Ethibond. The short external rotators were closed with the similar material.  The IT band was closed with a running #1 Vicryl, the subcu with 2-0 Vicryl, subcu with 3-0 Monocryl, and skin with skin clips.  Sterile bulky dressing was applied.  The patient was then placed in the supine position, knee immobilizer placed to the right lower extremity.  He was woken, extubated, and then transported to the postanesthesia recovery room without problems.     Vonna Kotyk. Durward Fortes, M.D.   ______________________________ Vonna Kotyk. Durward Fortes, M.D.    PWW/MEDQ  D:  10/23/2016  T:  10/23/2016  Job:  [id number]

## 2016-10-24 NOTE — Care Management Note (Signed)
Case Management Note  Patient Details  Name: Trevor Mitchell MRN: [id number] Date of Birth: 12/23/1965  Subjective/Objective:  51 yr old gentleman s/p  Right total hip arthroplasty.                 Action/Plan: Case manager spoke with patient and his wife via visual interpreter. Patient was preoperatively setup with [facility], no changes. DME has been delivered to patient. He will have family support at discharge.   Expected Discharge Date:   10/25/16               Expected Discharge Plan:  Home w Home Health Services  In-House Referral:  NA  Discharge planning Services  CM Consult  Post Acute Care Choice:  Durable Medical Equipment, Home Health Choice offered to:  Patient, Spouse (spoke with patient via visual interpreter)  DME Arranged:  3-N-1, CPM, Walker rolling DME Agency:  [employer]  HH Arranged:  PT HH [facility]  [facility]  Status of Service:  Completed, signed off  If discussed at [employer] of [employer] Meetings, dates discussed:    Additional Comments:  Durenda GuthrieBrady, Thinh Cuccaro Naomi, RN 10/24/2016, 10:14 AM

## 2016-10-24 NOTE — Anesthesia Postprocedure Evaluation (Signed)
Anesthesia Post Note  Patient: Trevor Mitchell  Procedure(s) Performed: Procedure(s) (LRB): RIGHT TOTAL HIP ARTHROPLASTY (Right)     Patient location during evaluation: PACU Anesthesia Type: General Level of consciousness: awake and alert Pain management: pain level controlled Vital Signs Assessment: post-procedure vital signs reviewed and stable Respiratory status: spontaneous breathing, nonlabored ventilation, respiratory function stable and patient connected to nasal cannula oxygen Cardiovascular status: blood pressure returned to baseline and stable Postop Assessment: no signs of nausea or vomiting Anesthetic complications: no    Last Vitals:  Vitals:   10/24/16 0131 10/24/16 0700  BP: 128/82 130/79  Pulse: 76 69  Resp: 15 16  Temp: 36.6 C 36.8 C    Last Pain:  Vitals:   10/24/16 0700  TempSrc: Oral  PainSc:                  Deliliah Spranger,JAMES TERRILL

## 2016-10-24 NOTE — Evaluation (Signed)
Occupational Therapy Evaluation Patient Details Name: Trevor Mitchell MRN: [id number] DOB: Jul 04, 1965 Today's Date: 10/24/2016    History of Present Illness Patient is a 51 y.o. 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  PMH positive for HTN and ETOH use.    Clinical Impression   PTA, pt was living with his wife and independent. Currently, pt requires Max A for LB ADLs and Min guard A functional mobility with RW. Pt performed tub transfers using 3N1 with Min Guard A. Pt would benefit from further acute OT to address LB ADLs with AE and facilitate safe dc. Recommend dc home once medically stable per physician.     Follow Up Recommendations  DC plan and follow up therapy as arranged by surgeon;Supervision/Assistance - 24 hour    Equipment Recommendations  3 in 1 bedside commode (3N1 in pt's room)    Recommendations for Other Services PT consult     Precautions / Restrictions Precautions Precautions: Fall;Posterior Hip Precaution Booklet Issued: Yes (comment) Precaution Comments: Reviewed precautions. Pt able to recall 3/3 independently Required Braces or Orthoses: Knee Immobilizer - Right Knee Immobilizer - Right: Other (comment) (for precautions) Restrictions Weight Bearing Restrictions: Yes RLE Weight Bearing: Weight bearing as tolerated      Mobility Bed Mobility Overal bed mobility: Needs Assistance Bed Mobility: Supine to Sit     Supine to sit: Min assist     General bed mobility comments: Pt in recliner upon arrival  Transfers Overall transfer level: Needs assistance Equipment used: Rolling walker (2 wheeled) Transfers: Sit to/from Stand Sit to Stand: Min guard         General transfer comment: cues for technique, assist for balance    Balance Overall balance assessment: Needs assistance Sitting-balance support: Feet supported Sitting balance-Leahy Scale: Good     Standing balance support: No upper extremity supported Standing  balance-Leahy Scale: Fair Standing balance comment: able to take hands off walker during breaks in ambulation without LOB                           ADL either performed or assessed with clinical judgement   ADL Overall ADL's : Needs assistance/impaired Eating/Feeding: Set up;Sitting   Grooming: Set up;Sitting   Upper Body Bathing: Set up;Sitting   Lower Body Bathing: Maximal assistance;Sit to/from stand Lower Body Bathing Details (indicate cue type and reason): Pt will need education on AE for LB ADLs Upper Body Dressing : Set up;Sitting   Lower Body Dressing: Maximal assistance;Sit to/from stand Lower Body Dressing Details (indicate cue type and reason): Pt will need education on AE for LB ADLs Toilet Transfer: Min guard;RW;Ambulation (Simulated to recliner)       Tub/ Shower Transfer: Tub transfer;Min guard;Ambulation;3 in 1;Rolling walker Tub/Shower Transfer Details (indicate cue type and reason): Pt demosntrating good understanding of tub transfer and was able to verbalize all the steps once back in the room Functional mobility during ADLs: Min guard;Rolling walker General ADL Comments: Pt demosntrating good progress post surgery. Provided education on tub transfer. Pt will need education on LB ADLs and adjusting 3N1 over toilet to adhere to 90 degree precaution     Vision         Perception     Praxis      Pertinent Vitals/Pain Pain Assessment: 0-10 Pain Score: 3  Pain Location: Right hip Pain Descriptors / Indicators: Sore;Discomfort;Dull Pain Intervention(s): Monitored during session;Limited activity within patient's tolerance;Repositioned  Hand Dominance Right   Extremity/Trunk Assessment Upper Extremity Assessment Upper Extremity Assessment: Overall WFL for tasks assessed   Lower Extremity Assessment Lower Extremity Assessment: Defer to PT evaluation RLE Deficits / Details: AAROM limited by precautions, strength hip flexion 2/5, knee  extension at least 3/5.   Cervical / Trunk Assessment Cervical / Trunk Assessment: Normal   Communication Communication Communication: Prefers language other than English;Interpreter utilized;Other (comment) Chanetta Marshall [phone])   Cognition Arousal/Alertness: Awake/alert Behavior During Therapy: WFL for tasks assessed/performed Overall Cognitive Status: Within Functional Limits for tasks assessed                                     General Comments  Use of video interpreter throughout, Chanetta Marshall [phone]    Exercises    Shoulder Instructions      Home Living Family/patient expects to be discharged to:: Private residence Living Arrangements: Spouse/significant other Available Help at Discharge: Family;Available PRN/intermittently (Pt reporting that his wife will be home the first week) Type of Home: House Home Access: Stairs to enter [employer] of Steps: 1   Home Layout: One level     Bathroom Shower/Tub: Tub/shower unit         Home Equipment: None   Additional Comments: BSC and RW deliver to pt's room upon OT arrival      Prior Functioning/Environment Level of Independence: Independent                 OT Problem List: Decreased range of motion;Decreased knowledge of use of DME or AE;Decreased knowledge of precautions;Pain      OT Treatment/Interventions: Self-care/ADL training;Therapeutic exercise;Energy conservation;DME and/or AE instruction;Patient/family education;Therapeutic activities    OT Goals(Current goals can be found in the care plan section) Acute Rehab OT Goals Patient Stated Goal: To return home. to normal life OT Goal Formulation: With patient Time For Goal Achievement: 11/07/16 Potential to Achieve Goals: Good ADL Goals Pt Will Perform Lower Body Bathing: with min guard assist;sit to/from stand;with caregiver independent in assisting;with adaptive equipment Pt Will Perform Lower Body Dressing: with min guard assist;with  caregiver independent in assisting;with adaptive equipment;sit to/from stand Pt Will Transfer to Toilet: with min guard assist;bedside commode;ambulating (Adhering to posterior hip precautions)  OT Frequency: Min 2X/week   Barriers to D/C:            Co-evaluation              AM-PAC PT "6 Clicks" Daily Activity     Outcome Measure Help from another person eating meals?: None Help from another person taking care of personal grooming?: A Little Help from another person toileting, which includes using toliet, bedpan, or urinal?: A Little Help from another person bathing (including washing, rinsing, drying)?: A Little Help from another person to put on and taking off regular upper body clothing?: None Help from another person to put on and taking off regular lower body clothing?: A Lot 6 Click Score: 19   End of Session Equipment Utilized During Treatment: Gait belt;Rolling walker Nurse Communication: Mobility status  Activity Tolerance: Patient tolerated treatment well Patient left: in chair;with call bell/phone within reach  OT Visit Diagnosis: Other abnormalities of gait and mobility (R26.89);Pain Pain - Right/Left: Right Pain - part of body: Leg                Time: 8119-1478 OT Time Calculation (min): 46 min Charges:  OT General Charges $OT  Visit: 1 Procedure OT Evaluation $OT Eval Low Complexity: 1 Procedure OT Treatments $Self Care/Home Management : 23-37 mins G-Codes:     Emmi Wertheim MSOT, OTR/L Acute Rehab Pager: [phone] Office: [phone]  Theodoro GristCharis M Thierry Dobosz 10/24/2016, 12:56 PM

## 2016-10-25 ENCOUNTER — Encounter (HOSPITAL_COMMUNITY): Payer: Self-pay | Admitting: General Practice

## 2016-10-25 LAB — BASIC METABOLIC PANEL
Anion gap: 7 (ref 5–15)
BUN: 8 mg/dL (ref 6–20)
CHLORIDE: 104 mmol/L (ref 101–111)
CO2: 30 mmol/L (ref 22–32)
Calcium: 8.8 mg/dL — ABNORMAL LOW (ref 8.9–10.3)
Creatinine, Ser: 0.88 mg/dL (ref 0.61–1.24)
GFR calc Af Amer: >60 mL/min (ref >60)
GFR calc non Af Amer: >60 mL/min (ref >60)
Glucose, Bld: 103 mg/dL — ABNORMAL HIGH (ref 65–99)
POTASSIUM: 4.2 mmol/L (ref 3.5–5.1)
SODIUM: 141 mmol/L (ref 135–145)

## 2016-10-25 LAB — CBC
HEMATOCRIT: 32.9 % — AB (ref 39.0–52.0)
HEMOGLOBIN: 10.6 g/dL — AB (ref 13.0–17.0)
MCH: 29.6 pg (ref 26.0–34.0)
MCHC: 32.2 g/dL (ref 30.0–36.0)
MCV: 91.9 fL (ref 78.0–100.0)
Platelets: 204 10*3/uL (ref 150–400)
RBC: 3.58 MIL/uL — AB (ref 4.22–5.81)
RDW: 13.4 % (ref 11.5–15.5)
WBC: 7.9 10*3/uL (ref 4.0–10.5)

## 2016-10-25 MED ORDER — METHOCARBAMOL 500 MG PO TABS: 500.0000 mg | ORAL_TABLET | Freq: Four times a day (QID) | ORAL | 0 refills | Status: DC | PRN

## 2016-10-25 MED ORDER — PNEUMOCOCCAL VAC POLYVALENT 25 MCG/0.5ML IJ INJ: 0.5000 mL | INJECTION | INTRAMUSCULAR | Status: DC

## 2016-10-25 MED ORDER — OXYCODONE HCL 5 MG PO TABS: 5.0000 mg | ORAL_TABLET | ORAL | 0 refills | Status: DC | PRN

## 2016-10-25 MED ORDER — ASPIRIN 325 MG PO TBEC: 325.0000 mg | DELAYED_RELEASE_TABLET | Freq: Two times a day (BID) | ORAL | 0 refills | Status: DC

## 2016-10-25 NOTE — Progress Notes (Signed)
Interpreter Graciela Namihira for Nancy RN admitting  °

## 2016-10-25 NOTE — Progress Notes (Signed)
Discharge instructions, RX's and follow up appts explained and provided to patient verbalized understanding. Home care set up for patient contact information provided , [facility]. Patient left floor via wheelchair accompanied by staff no c/o pain or shortness of breath at time of d/c.  Trenton Verne, Kae HellerMiranda Lynn, RN

## 2016-10-25 NOTE — Progress Notes (Signed)
Occupational Therapy Treatment Patient Details Name: Trevor BastosJuan Rafael Mitchell MRN: [id number] DOB: 12/05/1965 Today's Date: 10/25/2016    History of present illness Patient is a 51 y.o. 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  PMH positive for HTN and ETOH use.    OT comments  Pt progressing towards acute OT goals. Focus of session was LB ADL education with AE and functional transfers. D/c plan remains appropriate.    Follow Up Recommendations  DC plan and follow up therapy as arranged by surgeon;Supervision/Assistance - 24 hour    Equipment Recommendations  3 in 1 bedside commode (3N1 in pt's room)    Recommendations for Other Services      Precautions / Restrictions Precautions Precautions: Fall;Posterior Hip Precaution Booklet Issued: Yes (comment) Precaution Comments: Reviewed precautions. Pt able to recall 3/3 independently Required Braces or Orthoses: Knee Immobilizer - Right Knee Immobilizer - Right: Other (comment) (for precautions) Restrictions Weight Bearing Restrictions: Yes RLE Weight Bearing: Weight bearing as tolerated       Mobility Bed Mobility Overal bed mobility: Needs Assistance Bed Mobility: Supine to Sit     Supine to sit: Min assist Sit to supine: Min assist   General bed mobility comments: MinA for RLE assist into sitting; pt utilized bed rails.   Transfers Overall transfer level: Needs assistance Equipment used: Rolling walker (2 wheeled) Transfers: Sit to/from Stand Sit to Stand: Min guard         General transfer comment: Cues for technique with RLE placement. assist to position RLE with brace on. Pt requested removal of brace once in standing.     Balance Overall balance assessment: Needs assistance Sitting-balance support: Feet supported Sitting balance-Leahy Scale: Good     Standing balance support: No upper extremity supported Standing balance-Leahy Scale: Fair Standing balance comment: pt stood at sink with  supervision for ~5 min performing oral hygine and washed face. No UE support during ADLs.                            ADL either performed or assessed with clinical judgement   ADL Overall ADL's : Needs assistance/impaired             Lower Body Bathing: Moderate assistance;Sit to/from stand;With adaptive equipment       Lower Body Dressing: Moderate assistance;Sit to/from stand;With adaptive equipment               Functional mobility during ADLs: Min guard;Rolling walker General ADL Comments: Educated and provided demostration of LB AE and technique. In-room functional mobility with cues for sequencing of RLW movement with R turns. Discussed home setup for ADLs.      Vision       Perception     Praxis      Cognition Arousal/Alertness: Awake/alert Behavior During Therapy: WFL for tasks assessed/performed Overall Cognitive Status: Within Functional Limits for tasks assessed                                          Exercises Total Joint Exercises Ankle Circles/Pumps: AROM;Both;10 reps;Supine Short Arc Quad: AROM;Right;10 reps;Supine Hip ABduction/ADduction: AROM;Right;10 reps;Supine Straight Leg Raises: AAROM;Right;5 reps;Supine   Shoulder Instructions       General Comments      Pertinent Vitals/ Pain       Pain Assessment: 0-10 Pain Score: 4  Pain Location: Right hip Pain Descriptors / Indicators: Sore;Discomfort;Dull Pain Intervention(s): Monitored during session;Limited activity within patient's tolerance  Home Living Family/patient expects to be discharged to:: Private residence Living Arrangements: Spouse/significant other                                      Prior Functioning/Environment              Frequency  Min 2X/week        Progress Toward Goals  OT Goals(current goals can now be found in the care plan section)  Progress towards OT goals: Progressing toward goals  Acute Rehab OT  Goals Patient Stated Goal: To return home. to normal life OT Goal Formulation: With patient Time For Goal Achievement: 11/07/16 Potential to Achieve Goals: Good ADL Goals Pt Will Perform Lower Body Bathing: with min guard assist;sit to/from stand;with caregiver independent in assisting;with adaptive equipment Pt Will Perform Lower Body Dressing: with min guard assist;with caregiver independent in assisting;with adaptive equipment;sit to/from stand Pt Will Transfer to Toilet: with min guard assist;bedside commode;ambulating  Plan Discharge plan remains appropriate    Co-evaluation                 AM-PAC PT "6 Clicks" Daily Activity     Outcome Measure   Help from another person eating meals?: None Help from another person taking care of personal grooming?: A Little Help from another person toileting, which includes using toliet, bedpan, or urinal?: A Little Help from another person bathing (including washing, rinsing, drying)?: A Little Help from another person to put on and taking off regular upper body clothing?: None Help from another person to put on and taking off regular lower body clothing?: A Little 6 Click Score: 20    End of Session Equipment Utilized During Treatment: Rolling walker  OT Visit Diagnosis: Other abnormalities of gait and mobility (R26.89);Pain Pain - Right/Left: Right Pain - part of body: Leg   Activity Tolerance Patient tolerated treatment well   Patient Left Other (comment);with family/visitor present (with PT)   Nurse Communication          Time: 1610-9604 OT Time Calculation (min): 25 min  Charges: OT General Charges $OT Visit: 1 Procedure OT Treatments $Self Care/Home Management : 23-37 mins    Pilar Grammes 10/25/2016, 11:28 AM

## 2016-10-25 NOTE — Progress Notes (Signed)
Physical Therapy Treatment Patient Details Name: Trevor Mitchell MRN: [id number] DOB: 01/04/1966 Today's Date: 10/25/2016    History of Present Illness Patient is a 51 y.o. 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  PMH positive for HTN and ETOH use.     PT Comments    Pt is progressing towards all goals. Completed reviewing HEP with pt and performed stair training. Pt is expected to d/c home this PM with HHPT to follow up.   Follow Up Recommendations  Home health PT;DC plan and follow up therapy as arranged by surgeon     Equipment Recommendations  Rolling walker with 5" wheels;3in1 (PT)    Recommendations for Other Services       Precautions / Restrictions Precautions Precautions: Fall;Posterior Hip Precaution Booklet Issued: Yes (comment) Precaution Comments: Reviewed precautions. Pt able to recall 3/3 independently Required Braces or Orthoses: Knee Immobilizer - Right Knee Immobilizer - Right: Other (comment) (for precautions) Restrictions Weight Bearing Restrictions: Yes RLE Weight Bearing: Weight bearing as tolerated    Mobility  Bed Mobility Overal bed mobility: Needs Assistance Bed Mobility: Supine to Sit;Sit to Supine     Supine to sit: Min assist Sit to supine: Min assist   General bed mobility comments: MinA for RLE assist; pt utilized bed rails.   Transfers Overall transfer level: Needs assistance Equipment used: Rolling walker (2 wheeled) Transfers: Sit to/from Stand Sit to Stand: Min guard         General transfer comment: Cues for technique with RLE placement.  Ambulation/Gait Ambulation/Gait assistance: Supervision Ambulation Distance (Feet): 270 Feet Assistive device: Rolling walker (2 wheeled) Gait Pattern/deviations: Step-through pattern Gait velocity: Decreased Gait velocity interpretation: Below normal speed for age/gender General Gait Details: Pt with slow cautious gait. Overall steady and good posture. Cues for  increased walking speed.   Stairs Stairs: Yes   Stair Management: No rails;Step to pattern;Forwards;With walker Number of Stairs: 1 General stair comments: pt with 1 curb like step outside of home. Min guard for safety and cueing for technique.  Wheelchair Mobility    Modified Rankin (Stroke Patients Only)       Balance Overall balance assessment: Needs assistance Sitting-balance support: Feet supported Sitting balance-Leahy Scale: Good     Standing balance support: No upper extremity supported Standing balance-Leahy Scale: Good Standing balance comment: pt stood at sink with supervision for ~5 min performing oral hygine and washed face. No UE support during ADLs.                             Cognition Arousal/Alertness: Awake/alert Behavior During Therapy: WFL for tasks assessed/performed Overall Cognitive Status: Within Functional Limits for tasks assessed                                        Exercises Total Joint Exercises Long Arc Quad: AROM;Right;10 reps;Seated Knee Flexion: AROM;Right;10 reps;Standing (towel under foot) Marching in Standing: AROM;Right;10 reps;Standing Standing Hip Extension: AROM;Right;10 reps;Standing    General Comments General comments (skin integrity, edema, etc.): Used of interperter throughout session. Wife present.      Pertinent Vitals/Pain Pain Assessment: Faces Faces Pain Scale: Hurts little more Pain Location: Right hip Pain Descriptors / Indicators: Sore;Discomfort;Dull Pain Intervention(s): Monitored during session;Limited activity within patient's tolerance    Home Living  Prior Function            PT Goals (current goals can now be found in the care plan section) Acute Rehab PT Goals Patient Stated Goal: To return home. to normal life PT Goal Formulation: With patient/family Time For Goal Achievement: 10/31/16 Potential to Achieve Goals: Good Progress  towards PT goals: Progressing toward goals    Frequency    7X/week      PT Plan Current plan remains appropriate    Co-evaluation              AM-PAC PT "6 Clicks" Daily Activity  Outcome Measure  Difficulty turning over in bed (including adjusting bedclothes, sheets and blankets)?: A Little Difficulty moving from lying on back to sitting on the side of the bed? : Total Difficulty sitting down on and standing up from a chair with arms (e.g., wheelchair, bedside commode, etc,.)?: Total Help needed moving to and from a bed to chair (including a wheelchair)?: A Little Help needed walking in hospital room?: A Little Help needed climbing 3-5 steps with a railing? : A Little 6 Click Score: 14    End of Session Equipment Utilized During Treatment: Gait belt Activity Tolerance: Patient tolerated treatment well Patient left: in bed;with call bell/phone within reach;with family/visitor present Nurse Communication: Mobility status PT Visit Diagnosis: Difficulty in walking, not elsewhere classified (R26.2);Pain Pain - Right/Left: Right Pain - part of body: Hip     Time: 0454-0981 PT Time Calculation (min) (ACUTE ONLY): 38 min  Charges:  $Gait Training: 23-37 mins $Therapeutic Exercise: 8-22 mins                    G Codes:       Kallie Locks, [place] Pager [phone] Acute Rehab   Sheral Apley 10/25/2016, 3:49 PM

## 2016-10-25 NOTE — Progress Notes (Signed)
PATIENT ID: Trevor Mitchell        MRN:  [id number]          DOB/AGE: [birth year] / 51 y.o.    Trevor Campbell, MD   Trevor Code, PA-C [address] [place], [place]  [address]                             [phone]   PROGRESS NOTE  Subjective:  negative for Chest Pain  negative for Shortness of Breath  negative for Nausea/Vomiting   negative for Calf Pain    Tolerating Diet: yes         Patient reports pain as mild and moderate.     Comfortable at rest, good effort in PT, pain controlled with meds  Objective: Vital signs in last 24 hours:   Patient Vitals for the past 24 hrs:  BP Temp Temp src Pulse Resp SpO2  10/24/16 2138 (!) 157/87 98.5 F (36.9 C) Oral 70 19 100 %  10/24/16 1409 117/70 98.4 F (36.9 C) Oral 71 18 100 %      Intake/Output from previous day:   08/08 0701 - 08/09 0700 In: 480 [P.O.:480] Out: 500 [Urine:500]   Intake/Output this shift:   No intake/output data recorded.   Intake/Output      08/08 0701 - 08/09 0700 08/09 0701 - 08/10 0700   P.O. 480    Total Intake(mL/kg) 480 (7.8)    Urine (mL/kg/hr) 500 (0.3)    Total Output 500     Net -20             LABORATORY DATA:  Recent Labs  10/24/16 0452 10/25/16 0531  WBC 9.2 7.9  HGB 10.6* 10.6*  HCT 32.2* 32.9*  PLT 216 204    Recent Labs  10/24/16 0452 10/25/16 0531  NA 138 141  K 3.6 4.2  CL 107 104  CO2 26 30  BUN 15 8  CREATININE 0.94 0.88  GLUCOSE 115* 103*  CALCIUM 8.3* 8.8*   Lab Results  Component Value Date   INR 1.00 10/16/2016    Recent Radiographic Studies :  Dg Hip Port Unilat With Pelvis 1v Right  Result Date: 10/23/2016 CLINICAL DATA:  51 year old male status post right hip replacement. EXAM: DG HIP (WITH OR WITHOUT PELVIS) 1V PORT RIGHT COMPARISON:  06/18/2016. FINDINGS: Portable AP and cross-table lateral views of the pelvis and right hip. Bipolar type right hip arthroplasty in place. Hardware appears intact and normally aligned. No  unexpected osseous changes identified. Overlying postoperative changes to the soft tissues with skin staples in place. Stable left hip. IMPRESSION: Right bipolar hip arthroplasty with no adverse features identified. Electronically Signed   By: Odessa Fleming M.D.   On: 10/23/2016 13:36     Examination:  General appearance: alert, cooperative and no distress  Wound Exam: clean, dry, intact   Drainage:  None: wound tissue dry  Motor Exam: EHL, FHL, Anterior Tibial and Posterior Tibial Intact  Sensory Exam: Superficial Peroneal, Deep Peroneal and Tibial normal  Vascular Exam: Normal  Assessment:    2 Days Post-Op  Procedure(s) (LRB): RIGHT TOTAL HIP ARTHROPLASTY (Right)  ADDITIONAL DIAGNOSIS:  Principal Problem:   Avascular necrosis of hip, right (HCC) Active Problems:   S/P prosthetic total arthroplasty of the hip  Acute Blood Loss Anemia-asymptomatic   Plan: Physical Therapy as ordered Weight Bearing as Tolerated (WBAT)  DVT Prophylaxis:  Aspirin, Foot Pumps and TED hose  DISCHARGE PLAN: Home  DISCHARGE NEEDS: HHPT, Walker and 3-in-1 comode seat Dressing changed, wound clean and dry, voiding without difficulty. Will discharge today       Trevor Trevor Mitchell  10/25/2016 11:01 AM  Patient ID: Trevor Mitchell, male   DOB: 02/23/1966, 51 y.o.   MRN: [id number]

## 2016-10-25 NOTE — Progress Notes (Signed)
Physical Therapy Treatment Patient Details Name: Trevor Mitchell MRN: [id number] DOB: [birth year] Today's Date: 10/25/2016    History of Present Illness Patient is a 51 y.o. 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  PMH positive for HTN and ETOH use.     PT Comments    Pt mobilizing well and progressing towards goals. Able to recall 3/3 hip precautions. Plan to continue progressing gait and home exorcizes. Will continue to follow acutely.    Follow Up Recommendations  Home health PT;DC plan and follow up therapy as arranged by surgeon     Equipment Recommendations  Rolling walker with 5" wheels;3in1 (PT)    Recommendations for Other Services       Precautions / Restrictions Precautions Precautions: Fall;Posterior Hip Precaution Booklet Issued: Yes (comment) Precaution Comments: Reviewed precautions. Pt able to recall 3/3 independently Required Braces or Orthoses: Knee Immobilizer - Right Knee Immobilizer - Right: Other (comment) (for precautions) Restrictions Weight Bearing Restrictions: Yes RLE Weight Bearing: Weight bearing as tolerated    Mobility  Bed Mobility Overal bed mobility: Needs Assistance Bed Mobility: Sit to Supine     Supine to sit: Min assist Sit to supine: Min assist   General bed mobility comments: Min A to progress R LE onto bed  Transfers Overall transfer level: Needs assistance Equipment used: Rolling walker (2 wheeled) Transfers: Sit to/from Stand Sit to Stand: Min guard         General transfer comment: Pt with slow cautious technique. Min guard for safety. no cues required.  Ambulation/Gait Ambulation/Gait assistance: Supervision Ambulation Distance (Feet): 270 Feet Assistive device: Rolling walker (2 wheeled) Gait Pattern/deviations: Step-through pattern Gait velocity: Decreased Gait velocity interpretation: Below normal speed for age/gender General Gait Details: Pt with slow cautious gait. Overall steady and  good posture. Cues for increased walking speed.   Stairs            Wheelchair Mobility    Modified Rankin (Stroke Patients Only)       Balance Overall balance assessment: Needs assistance Sitting-balance support: Feet supported Sitting balance-Leahy Scale: Good     Standing balance support: No upper extremity supported Standing balance-Leahy Scale: Good Standing balance comment: pt stood at sink with supervision for ~5 min performing oral hygine and washed face. No UE support during ADLs.                             Cognition Arousal/Alertness: Awake/alert Behavior During Therapy: WFL for tasks assessed/performed Overall Cognitive Status: Within Functional Limits for tasks assessed                                        Exercises Total Joint Exercises Ankle Circles/Pumps: AROM;Both;10 reps;Supine Short Arc Quad: AROM;Right;10 reps;Supine Hip ABduction/ADduction: AROM;Right;10 reps;Supine Straight Leg Raises: AAROM;Right;5 reps;Supine    General Comments        Pertinent Vitals/Pain Pain Assessment: 0-10 Pain Score: 4  Pain Location: Right hip Pain Descriptors / Indicators: Sore;Discomfort;Dull Pain Intervention(s): Monitored during session;Limited activity within patient's tolerance    Home Living Family/patient expects to be discharged to:: Private residence Living Arrangements: Spouse/significant other                  Prior Function            PT Goals (current goals can now be found  in the care plan section) Acute Rehab PT Goals Patient Stated Goal: To return home. to normal life PT Goal Formulation: With patient/family Time For Goal Achievement: 10/31/16 Potential to Achieve Goals: Good Progress towards PT goals: Progressing toward goals    Frequency    7X/week      PT Plan Current plan remains appropriate    Co-evaluation              AM-PAC PT "6 Clicks" Daily Activity  Outcome Measure   Difficulty turning over in bed (including adjusting bedclothes, sheets and blankets)?: A Little Difficulty moving from lying on back to sitting on the side of the bed? : Total Difficulty sitting down on and standing up from a chair with arms (e.g., wheelchair, bedside commode, etc,.)?: Total Help needed moving to and from a bed to chair (including a wheelchair)?: A Little Help needed walking in hospital room?: A Little Help needed climbing 3-5 steps with a railing? : A Little 6 Click Score: 14    End of Session Equipment Utilized During Treatment: Gait belt Activity Tolerance: Patient tolerated treatment well Patient left: in bed;with call bell/phone within reach;with family/visitor present   PT Visit Diagnosis: Difficulty in walking, not elsewhere classified (R26.2);Pain Pain - Right/Left: Right Pain - part of body: Hip     Time: [id number] PT Time Calculation (min) (ACUTE ONLY): 30 min  Charges:  $Gait Training: 8-22 mins $Therapeutic Exercise: 8-22 mins                    G Codes:       Kallie LocksHannah Kodi Guerrera, [place] Pager [phone] Acute Rehab    Sheral ApleyHannah E Matin Mattioli 10/25/2016, 10:40 AM

## 2016-10-25 NOTE — Discharge Summary (Signed)
Trevor CampbellPeter Whitfield, MD   Trevor CodeBrian Deland Slocumb, PA-C [address], [place], [place]  [id number]                             [phone]  PATIENT ID: Trevor BastosJuan Rafael Mitchell        MRN:  [id number]          DOB/AGE: 51/07/1965 / 51 y.o.    DISCHARGE SUMMARY  ADMISSION DATE:    10/23/2016 DISCHARGE DATE:   10/25/2016   ADMISSION DIAGNOSIS: S/P prosthetic total arthroplasty of the hip [Z96.649]    DISCHARGE DIAGNOSIS:  RIGHT HIP OSTEOARTHRITIS    ADDITIONAL DIAGNOSIS: Principal Problem:   Avascular necrosis of hip, right (HCC) Active Problems:   S/P prosthetic total arthroplasty of the hip  Past Medical History:  Diagnosis Date  . Arthritis   . Hypertension     PROCEDURE: Procedure(s): RIGHT TOTAL HIP ARTHROPLASTY  on 10/23/2016  CONSULTS: none    HISTORY: Trevor HampshireJuan Rafael Rodriguez-Tejadais a 51 y.o. male  Who has a history of pain and functional disability in the right hip(s) due to arthritis and patient has failed non-surgical conservative treatments for greater than 12 weeks to include corticosteriod injections.  Onset of symptoms was gradual starting 3 years ago with gradually worsening course since that time. Patient currently rates pain in the right hip at 8 out of 10 with activity. Patient has night pain, worsening of pain with activity and weight bearing, trendelenberg gait, pain that interfers with activities of daily living and pain with passive range of motion. Patient has evidence of subchondral cysts, subchondral sclerosis, joint subluxation, joint space narrowing and Femoral head collapse by imaging studies. This condition presents safety issues increasing the risk of falls.This patient has avascular necrosis of the hip.  There is no current active infection.  Several year history of progressive right hip pain. Mr. Trevor Munroeejadarelates that he was a heavy drinker several years ago after the death of his mother. He was "drinking quite heavily" with rumand other alcoholic beverages.He  denies any history of injury or trauma. He's drinking very "little at present and smokes up to 8 cigarettes a day. His pain is progressively noted in the right groin with referred pain to his right hip.   HOSPITAL COURSE:  Trevor Mitchell is a 51 y.o. admitted on 10/23/2016 and found to have a diagnosis of RIGHT HIP OSTEOARTHRITIS.  After appropriate laboratory studies were obtained  they were taken to the operating room on 10/23/2016 and underwent  Procedure(s): RIGHT TOTAL HIP ARTHROPLASTY  .   They were given perioperative antibiotics:  Anti-infectives    Start     Dose/Rate Route Frequency Ordered Stop   10/23/16 1600  ceFAZolin (ANCEF) IVPB 2g/100 mL premix     2 g 200 mL/hr over 30 Minutes Intravenous Every 6 hours 10/23/16 1544 10/23/16 2231   10/23/16 0930  ceFAZolin (ANCEF) IVPB 2g/100 mL premix     2 g 200 mL/hr over 30 Minutes Intravenous To ShortStay Surgical 10/22/16 1337 10/23/16 1034    .  Tolerated the procedure well.   Toradol was given post op.  POD #1, allowed out of bed to a chair.  PT for ambulation and exercise program.  IV saline locked.  O2 discontionued.  POD #2, continued PT and ambulation.  Dressing changed .  The remainder of the hospital course was dedicated to ambulation and strengthening.   The patient was discharged on 2 Days Post-Op  in  Stable condition.  Blood products given:none  DIAGNOSTIC STUDIES: Recent vital signs: Patient Vitals for the past 24 hrs:  BP Temp Temp src Pulse Resp SpO2  10/24/16 2138 (!) 157/87 98.5 F (36.9 C) Oral 70 19 100 %  10/24/16 1409 117/70 98.4 F (36.9 C) Oral 71 18 100 %       Recent laboratory studies:  Recent Labs  10/24/16 0452 10/25/16 0531  WBC 9.2 7.9  HGB 10.6* 10.6*  HCT 32.2* 32.9*  PLT 216 204    Recent Labs  10/24/16 0452 10/25/16 0531  NA 138 141  K 3.6 4.2  CL 107 104  CO2 26 30  BUN 15 8  CREATININE 0.94 0.88  GLUCOSE 115* 103*  CALCIUM 8.3* 8.8*   Lab Results    Component Value Date   INR 1.00 10/16/2016     Recent Radiographic Studies :  Dg Hip Port Unilat With Pelvis 1v Right  Result Date: 10/23/2016 CLINICAL DATA:  51 year old male status post right hip replacement. EXAM: DG HIP (WITH OR WITHOUT PELVIS) 1V PORT RIGHT COMPARISON:  06/18/2016. FINDINGS: Portable AP and cross-table lateral views of the pelvis and right hip. Bipolar type right hip arthroplasty in place. Hardware appears intact and normally aligned. No unexpected osseous changes identified. Overlying postoperative changes to the soft tissues with skin staples in place. Stable left hip. IMPRESSION: Right bipolar hip arthroplasty with no adverse features identified. Electronically Signed   By: Odessa Fleming M.D.   On: 10/23/2016 13:36    DISCHARGE INSTRUCTIONS: Discharge Instructions    Call MD / Call 911    Complete by:  As directed    If you experience chest pain or shortness of breath, CALL 911 and be transported to the hospital emergency room.  If you develope a fever above 101 F, pus (white drainage) or increased drainage or redness at the wound, or calf pain, call your surgeon's office.   Change dressing    Complete by:  As directed    DO NOT CHANGE DRESSING. KEEP ON TILL SEEN IN THE OFFICE   Constipation Prevention    Complete by:  As directed    Drink plenty of fluids.  Prune juice may be helpful.  You may use a stool softener, such as Colace (over the counter) 100 mg twice a day.  Use MiraLax (over the counter) for constipation as needed.   Diet general    Complete by:  As directed    Discharge instructions    Complete by:  As directed    INSTRUCTIONS AFTER JOINT REPLACEMENT   Remove items at home which could result in a fall. This includes throw rugs or furniture in walking pathways ICE to the affected joint every three hours while awake for 30 minutes at a time, for at least the first 3-5 days, and then as needed for pain and swelling.  Continue to use ice for pain and swelling.  You may notice swelling that will progress down to the foot and ankle.  This is normal after surgery.  Elevate your leg when you are not up walking on it.   Continue to use the breathing machine you got in the hospital (incentive spirometer) which will help keep your temperature down.  It is common for your temperature to cycle up and down following surgery, especially at night when you are not up moving around and exerting yourself.  The breathing machine keeps your lungs expanded and your temperature down.   DIET:  As you were doing prior to hospitalization, we recommend a well-balanced diet.  DRESSING / WOUND CARE / SHOWERING  Keep the surgical dressing until follow up.  The dressing is water proof, so you can shower without any extra covering.  IF THE DRESSING FALLS OFF or the wound gets wet inside, change the dressing with sterile gauze.  Please use good hand washing techniques before changing the dressing.  Do not use any lotions or creams on the incision until instructed by your surgeon.    ACTIVITY  Increase activity slowly as tolerated, but follow the weight bearing instructions below.   No driving for 6 weeks or until further direction given by your physician.  You cannot drive while taking narcotics.  No lifting or carrying greater than 10 lbs. until further directed by your surgeon. Avoid periods of inactivity such as sitting longer than an hour when not asleep. This helps prevent blood clots.  You may return to work once you are authorized by your doctor.     WEIGHT BEARING   Weight bearing as tolerated with assist device (walker, cane, etc) as directed, use it as long as suggested by your surgeon or therapist, typically at least 4-6 weeks.   EXERCISES  Results after joint replacement surgery are often greatly improved when you follow the exercise, range of motion and muscle strengthening exercises prescribed by your doctor. Safety measures are also important to protect the  joint from further injury. Any time any of these exercises cause you to have increased pain or swelling, decrease what you are doing until you are comfortable again and then slowly increase them. If you have problems or questions, call your caregiver or physical therapist for advice.   Rehabilitation is important following a joint replacement. After just a few days of immobilization, the muscles of the leg can become weakened and shrink (atrophy).  These exercises are designed to build up the tone and strength of the thigh and leg muscles and to improve motion. Often times heat used for twenty to thirty minutes before working out will loosen up your tissues and help with improving the range of motion but do not use heat for the first two weeks following surgery (sometimes heat can increase post-operative swelling).   These exercises can be done on a training (exercise) mat, on a table or on a bed. Use whatever works the best and is most comfortable for you.    Use music or television while you are exercising so that the exercises are a pleasant break in your day. This will make your life better with the exercises acting as a break in your routine that you can look forward to.   Perform all exercises about fifteen times, three times per day or as directed.  You should exercise both the operative leg and the other leg as well.   Exercises include:  Quad Sets - Tighten up the muscle on the front of the thigh (Quad) and hold for 5-10 seconds.   Straight Leg Raises - With your knee straight (if you were given a brace, keep it on), lift the leg to 60 degrees, hold for 3 seconds, and slowly lower the leg.  Perform this exercise against resistance later as your leg gets stronger.  Leg Slides: Lying on your back, slowly slide your foot toward your buttocks, bending your knee up off the floor (only go as far as is comfortable). Then slowly slide your foot back down until your leg is flat on the  floor again.  Angel  Wings: Lying on your back spread your legs to the side as far apart as you can without causing discomfort.  Hamstring Strength:  Lying on your back, push your heel against the floor with your leg straight by tightening up the muscles of your buttocks.  Repeat, but this time bend your knee to a comfortable angle, and push your heel against the floor.  You may put a pillow under the heel to make it more comfortable if necessary.   A rehabilitation program following joint replacement surgery can speed recovery and prevent re-injury in the future due to weakened muscles. Contact your doctor or a physical therapist for more information on knee rehabilitation.    CONSTIPATION  Constipation is defined medically as fewer than three stools per week and severe constipation as less than one stool per week.  Even if you have a regular bowel pattern at home, your normal regimen is likely to be disrupted due to multiple reasons following surgery.  Combination of anesthesia, postoperative narcotics, change in appetite and fluid intake all can affect your bowels.   YOU MUST use at least one of the following options; they are listed in order of increasing strength to get the job done.  They are all available over the counter, and you may need to use some, POSSIBLY even all of these options:    Drink plenty of fluids (prune juice may be helpful) and high fiber foods Colace 100 mg by mouth twice a day  Senokot for constipation as directed and as needed Dulcolax (bisacodyl), take with full glass of water  Miralax (polyethylene glycol) once or twice a day as needed.  If you have tried all these things and are unable to have a bowel movement in the first 3-4 days after surgery call either your surgeon or your primary doctor.    If you experience loose stools or diarrhea, hold the medications until you stool forms back up.  If your symptoms do not get better within 1 week or if they get worse, check with your doctor.   If you experience "the worst abdominal pain ever" or develop nausea or vomiting, please contact the office immediately for further recommendations for treatment.   ITCHING:  If you experience itching with your medications, try taking only a single pain pill, or even half a pain pill at a time.  You can also use Benadryl over the counter for itching or also to help with sleep.   TED HOSE STOCKINGS:  Use stockings on both legs until for at least 2 weeks or as directed by physician office. They may be removed at night for sleeping.  MEDICATIONS:  See your medication summary on the "After Visit Summary" that nursing will review with you.  You may have some home medications which will be placed on hold until you complete the course of blood thinner medication.  It is important for you to complete the blood thinner medication as prescribed.  PRECAUTIONS:  If you experience chest pain or shortness of breath - call 911 immediately for transfer to the hospital emergency department.   If you develop a fever greater that 101 F, purulent drainage from wound, increased redness or drainage from wound, foul odor from the wound/dressing, or calf pain - CONTACT YOUR SURGEON.  FOLLOW-UP APPOINTMENTS:  If you do not already have a post-op appointment, please call the office for an appointment to be seen by your surgeon.  Guidelines for how soon to be seen are listed in your "After Visit Summary", but are typically between 1-4 weeks after surgery.  OTHER INSTRUCTIONS:   Knee Replacement:  Do not place pillow under knee, focus on keeping the knee straight while resting. CPM instructions: 0-90 degrees, 2 hours in the morning, 2 hours in the afternoon, and 2 hours in the evening. Place foam block, curve side up under heel at all times except when in CPM or when walking.  DO NOT modify, tear, cut, or change the foam block in any way.  MAKE SURE YOU:  Understand these  instructions.  Get help right away if you are not doing well or get worse.    Thank you for letting us be a part of your medical care team.  It is a privilege we respect greatly.  We hope these instructions will help you stay on track for a fast and full recovery!   Driving restrictions    Complete by:  As directed    No driving for 6 weeks   Follow the hip precautions as taught in Physical Therapy    Complete by:  As directed    Increase activity slowly as tolerated    Complete by:  As directed    Lifting restrictions    Complete by:  As directed    No lifting for 6 weeks   Patient may shower    Complete by:  As directed    YOU MAY SHOWER OVER THE DRESSING.  DO NOT REMOVE DRESSING   TED hose    Complete by:  As directed    Use stockings (TED hose) for 2 weeks on RIGHT leg(s).  You may remove them at night for sleeping.   Weight bearing as tolerated    Complete by:  As directed    Laterality:  right   Extremity:  Lower      DISCHARGE MEDICATIONS:   Allergies as of 10/25/2016      Reactions   No Known Allergies       Medication List    STOP taking these medications   traMADol 50 MG tablet Commonly known as:  ULTRAM     TAKE these medications   aspirin 325 MG EC tablet Take 1 tablet (325 mg total) by mouth 2 (two) times daily after a meal.   losartan 100 MG tablet Commonly known as:  COZAAR Take 1 tablet (100 mg total) by mouth daily.   methocarbamol 500 MG tablet Commonly known as:  ROBAXIN Take 1 tablet (500 mg total) by mouth every 6 (six) hours as needed for muscle spasms.   oxyCODONE 5 MG immediate release tablet Commonly known as:  Oxy IR/ROXICODONE Take 1-2 tablets (5-10 mg total) by mouth every 4 (four) hours as needed (moderate to severe breakthrough pain).            Durable Medical Equipment        Start     Ordered   10/23/16 1632  DME Walker rolling  Once    Question:  Patient needs a walker to treat with the following condition  Answer:   S/P total hip arthroplasty   10/23/16 1631   10/23/16 1632  DME 3 n 1  Once     10/23/16 1631   10/23/16 1632  DME Bedside commode  Once  Question:  Patient needs a bedside commode to treat with the following condition  Answer:  S/P total hip arthroplasty   10/23/16 1631      FOLLOW UP VISIT:   Follow-up Information    [facility] Follow up.   Specialty:  Home Health Services Why:  A representative from [facility] will contact you to arrange start date and time for your therapy. Contact information: [address] [place] [address] [phone]        Valeria Batman, MD Follow up on 11/05/2016.   Specialty:  Orthopedic Surgery Contact information: [address] [place] [address] [phone]           DISPOSITION:   Home  CONDITION:  Stable   Oris Drone. Aleda [facility] [phone]  10/25/2016 11:38 AM

## 2016-10-26 ENCOUNTER — Telehealth (INDEPENDENT_AMBULATORY_CARE_PROVIDER_SITE_OTHER): Payer: Self-pay | Admitting: Orthopaedic Surgery

## 2016-10-26 ENCOUNTER — Other Ambulatory Visit: Payer: Self-pay | Admitting: Medical

## 2016-10-26 NOTE — Telephone Encounter (Signed)
Spoke with Kelly ServicesCharie and said OK to orders. Meds have not been filled

## 2016-10-26 NOTE — Telephone Encounter (Signed)
PT needs verbal order for Home PT 2x/week for 1/wwk, 3xwk/1 wk, then 1xwk/1wk. Patient taknig Tramadol for pain. Needs ASA, Robaxin, and Oxycodone filled.

## 2016-10-30 NOTE — Telephone Encounter (Signed)
Pt stated did not write down all his bp check for the pass 2 wks but he states the ever since he started this last rx that was given to him for bp has been: 128/87, 137/85 but when he does not take it his bp goes up to 158/82. Pt stated that this last rx has been great (makes him feel better) and that he only has one pill left, if possible to refill ASAP.

## 2016-10-30 NOTE — Telephone Encounter (Signed)
Thanks,

## 2016-10-30 NOTE — Telephone Encounter (Signed)
Pt informed rx sent to pharmacy.

## 2016-10-30 NOTE — Telephone Encounter (Signed)
Ramon DredgeEdward-- please see below. I went ahead and sent refill of the 100mg  once daily dose.

## 2016-10-30 NOTE — Telephone Encounter (Signed)
Trevor Mitchell-- pt is requesting refill of losartan and Trevor Dredgedward wanted him to call us with his BP readings 3 weeks after his visit in June. I do not see that pt ever contacted us. Can you call him and ask for his readings over the last 1-2 weeks? Trevor Dredgedward was going to consider reducing his dose if readings were too low and we need to see what they have been before we send refill to pharmacy.  Thanks!

## 2016-11-07 ENCOUNTER — Ambulatory Visit (INDEPENDENT_AMBULATORY_CARE_PROVIDER_SITE_OTHER): Payer: BLUE CROSS/BLUE SHIELD | Admitting: Orthopedic Surgery

## 2016-11-07 ENCOUNTER — Encounter (INDEPENDENT_AMBULATORY_CARE_PROVIDER_SITE_OTHER): Payer: Self-pay | Admitting: Orthopedic Surgery

## 2016-11-07 ENCOUNTER — Ambulatory Visit (INDEPENDENT_AMBULATORY_CARE_PROVIDER_SITE_OTHER): Payer: Self-pay

## 2016-11-07 VITALS — BP 148/82 | HR 67 | Ht 70.0 in | Wt 136.0 lb

## 2016-11-07 DIAGNOSIS — Z96641 Presence of right artificial hip joint: Secondary | ICD-10-CM | POA: Diagnosis not present

## 2016-11-07 MED ORDER — HYDROCODONE-ACETAMINOPHEN 7.5-325 MG PO TABS: 1.0000 | ORAL_TABLET | Freq: Four times a day (QID) | ORAL | 0 refills | Status: DC | PRN

## 2016-11-07 NOTE — Progress Notes (Signed)
Office Visit Note   Patient: Trevor Mitchell           Date of Birth: 06/08/1965           MRN: [id number] Visit Date: 11/07/2016              Requested by: Esperanza Richters, PA-C [address] [place], [place] [address] PCP: Esperanza Richters, PA-C   Assessment & Plan: Visit Diagnoses:  1. Status post total replacement of right hip   2. Presence of right artificial hip joint     Plan:  #1: Continue ambulation as instructed by PT #2: Continue hip precautions  Follow-Up Instructions: Return in about 3 weeks (around 11/28/2016).   Orders:  Orders Placed This Encounter  Procedures  . XR HIP UNILAT W OR W/O PELVIS 2-3 VIEWS RIGHT   Meds ordered this encounter  Medications  . HYDROcodone-acetaminophen (NORCO) 7.5-325 MG tablet    Sig: Take 1-2 tablets by mouth every 6 (six) hours as needed for moderate pain.    Dispense:  40 tablet    Refill:  0    Order Specific Question:   Supervising Provider    Answer:   Valeria Batman [[id number]]      Procedures: No procedures performed   Clinical Data: No additional findings.   Subjective: Chief Complaint  Patient presents with  . Right Hip - Routine Post Op    [name] is a 51 y o that is 2 weeks status post Right total Hip replacement. No fevers, no drainage    Jermario is seen today for follow-up of his right total hip replacement now 15 days postop. He is doing very well. He is ambulating without any assisted devices. Physical therapy has stopped the use of his crutches/walker. States minimal pain. Denies any neurovascular cup was.    Review of Systems  Constitutional: Negative for fatigue.  HENT: Negative for hearing loss.   Respiratory: Negative for apnea, chest tightness and shortness of breath.   Cardiovascular: Negative for chest pain, palpitations and leg swelling.  Gastrointestinal: Negative for blood in stool, constipation and diarrhea.  Genitourinary: Negative for difficulty urinating.    Musculoskeletal: Negative for arthralgias, back pain, joint swelling, myalgias, neck pain and neck stiffness.  Neurological: Negative for weakness, numbness and headaches.  Hematological: Does not bruise/bleed easily.  Psychiatric/Behavioral: Negative for sleep disturbance. The patient is not nervous/anxious.      Objective: Vital Signs: BP (!) 148/82   Pulse 67   Ht 5\' 10"  (1.778 m)   Wt 136 lb (61.7 kg)   BMI 19.51 kg/m   Physical Exam  Ortho Exam  His wound is clean and dry no signs of infection. No swelling noted. No calf pain. Neurovascular intact distally.  Specialty Comments:  No specialty comments available.  Imaging: Xr Hip Unilat W Or W/o Pelvis 2-3 Views Right  Result Date: 11/07/2016 Two-view x-ray of the right hip and AP pelvis and right hip reveals excellent position alignment of the prosthesis. No adverse sequela noted    PMFS History: Patient Active Problem List   Diagnosis Date Noted  . Avascular necrosis of hip, right (HCC) 10/23/2016  . S/P prosthetic total arthroplasty of the hip 10/23/2016   Past Medical History:  Diagnosis Date  . Arthritis   . Hypertension     Family History  Problem Relation Age of Onset  . High blood pressure Mother   . Diabetes Mother   . Asthma Sister   .  Hypertension Sister   . Other Maternal Uncle        enlarged heart    Past Surgical History:  Procedure Laterality Date  . TOTAL HIP ARTHROPLASTY Right 10/23/2016   Procedure: RIGHT TOTAL HIP ARTHROPLASTY;  Surgeon: Valeria Batman, MD;  Location: [facility] OR;  Service: Orthopedics;  Laterality: Right;  . WISDOM TOOTH EXTRACTION     back tooth a long time ago   Social History   Occupational History  . Not on file.   Social History Main Topics  . Smoking status: Current Every Day Smoker    Packs/day: 0.50    Years: 35.00    Types: Cigarettes  . Smokeless tobacco: Never Used     Comment: 8-10 cigarettes daily  . Alcohol use Yes     Comment: 1 liter per month  / 48 oz beer every 2 weeks.  . Drug use: No     Comment: previous cocaine user long time ago  . Sexual activity: Yes

## 2016-11-21 ENCOUNTER — Ambulatory Visit (INDEPENDENT_AMBULATORY_CARE_PROVIDER_SITE_OTHER): Payer: BLUE CROSS/BLUE SHIELD | Admitting: Orthopedic Surgery

## 2016-11-27 ENCOUNTER — Ambulatory Visit (INDEPENDENT_AMBULATORY_CARE_PROVIDER_SITE_OTHER): Payer: BLUE CROSS/BLUE SHIELD | Admitting: Orthopedic Surgery

## 2016-11-27 ENCOUNTER — Encounter (INDEPENDENT_AMBULATORY_CARE_PROVIDER_SITE_OTHER): Payer: Self-pay | Admitting: Orthopedic Surgery

## 2016-11-27 ENCOUNTER — Ambulatory Visit (INDEPENDENT_AMBULATORY_CARE_PROVIDER_SITE_OTHER): Payer: BLUE CROSS/BLUE SHIELD

## 2016-11-27 VITALS — BP 137/80 | HR 60 | Resp 14 | Ht 72.0 in | Wt 140.0 lb

## 2016-11-27 DIAGNOSIS — M25562 Pain in left knee: Secondary | ICD-10-CM

## 2016-11-27 DIAGNOSIS — Z96641 Presence of right artificial hip joint: Secondary | ICD-10-CM

## 2016-11-27 MED ORDER — DICLOFENAC SODIUM 50 MG PO TBEC: 50.0000 mg | DELAYED_RELEASE_TABLET | Freq: Two times a day (BID) | ORAL | 0 refills | Status: DC

## 2016-11-27 MED ORDER — DICLOFENAC SODIUM 50 MG PO TBEC
50.0000 mg | DELAYED_RELEASE_TABLET | Freq: Two times a day (BID) | ORAL | 0 refills | Status: DC
Start: 2016-11-27 — End: 2017-07-18

## 2016-11-27 NOTE — Progress Notes (Signed)
Office Visit Note   Patient: Trevor Mitchell           Date of Birth: 08/31/1965           MRN: [id number] Visit Date: 11/27/2016              Requested by: Trevor Mitchell, Edward, PA-C [address] [place], [place] [id number] PCP: Trevor Mitchell, Edward, PA-C   Assessment & Plan: Visit Diagnoses:  1. Status post total hip replacement, right   2. Acute pain of left knee     Plan:  #1: Stop aspirin #2: Diclofenac 50 mg twice a day with meals #3: Continue his exercise program #4: If he still symptomatic on the diclofenac week a positive try a corticosteroid injection at his return.  Follow-Up Instructions: Return in about 3 weeks (around 12/18/2016).   Orders:  Orders Placed This Encounter  Procedures  . XR Knee Complete 4 Views Right   Meds ordered this encounter  Medications  . diclofenac (VOLTAREN) 50 MG EC tablet    Sig: Take 1 tablet (50 mg total) by mouth 2 (two) times daily.    Dispense:  60 tablet    Refill:  0    Order Specific Question:   Supervising Provider    Answer:   Trevor Mitchell [[id number]]      Procedures: No procedures performed   Clinical Data: No additional findings.   Subjective: Chief Complaint  Patient presents with  . Right Hip - Routine Post Op    Trevor Mitchell is a 51 y o that is 4.5weeks status post Right THA. Pt relates thru an interpreter that he is taking very litle pain meds, mainly at night. He is driving.     1 is seen today he is now 4 weeks status post right total hip arthroplasty. He is doing well from that standpoint with no real pain in the hip or groin area. He is now complaining of some pain in the heel aspect on the right. He states that prior to his surgery he did have some pain in the area that was referred from his hip. He points to the medial aspect of his knee.    Review of Systems  Constitutional: Negative for fatigue.  HENT: Negative for hearing loss.   Respiratory: Negative for apnea,  chest tightness and shortness of breath.   Cardiovascular: Negative for chest pain, palpitations and leg swelling.  Gastrointestinal: Negative for blood in stool, constipation and diarrhea.  Genitourinary: Negative for difficulty urinating.  Musculoskeletal: Positive for back pain. Negative for arthralgias, joint swelling, myalgias, neck pain and neck stiffness.  Neurological: Negative for weakness, numbness and headaches.  Hematological: Does not bruise/bleed easily.  Psychiatric/Behavioral: Negative for sleep disturbance. The patient is not nervous/anxious.      Objective: Vital Signs: BP 137/80   Pulse 60   Resp 14   Ht 6' (1.829 m)   Wt 140 lb (63.5 kg)   BMI 18.99 kg/m   Physical Exam  Constitutional: He is oriented to person, place, and time. He appears well-developed and well-nourished.  HENT:  Head: Normocephalic and atraumatic.  Eyes: Pupils are equal, round, and reactive to light. EOM are normal.  Pulmonary/Chest: Effort normal.  Neurological: He is alert and oriented to person, place, and time.  Skin: Skin is warm and dry.  Psychiatric: He has a normal mood and affect. His behavior is normal. Judgment and thought content normal.    Ortho Exam  The right  hip reveals the wound be healing. No signs of infection. He's got excellent motion. No pain per se in the groin with range of motion. He though does point to his knee as I do the testing.  Right knee today does not reveal any effusion at all. No warmth or erythema. Good range of motion full extension and flexion to about 110. Good ligamentous stability.  Specialty Comments:  No specialty comments available.  Imaging: No results found.   PMFS History: Patient Active Problem List   Diagnosis Date Noted  . Avascular necrosis of hip, right (HCC) 10/23/2016  . S/P prosthetic total arthroplasty of the hip 10/23/2016   Past Medical History:  Diagnosis Date  . Arthritis   . Hypertension     Family History    Problem Relation Age of Onset  . High blood pressure Mother   . Diabetes Mother   . Asthma Sister   . Hypertension Sister   . Other Maternal Uncle        enlarged heart    Past Surgical History:  Procedure Laterality Date  . TOTAL HIP ARTHROPLASTY Right 10/23/2016   Procedure: RIGHT TOTAL HIP ARTHROPLASTY;  Surgeon: Trevor Batman, MD;  Location: [facility] OR;  Service: Orthopedics;  Laterality: Right;  . WISDOM TOOTH EXTRACTION     back tooth a long time ago   Social History   Occupational History  . Not on file.   Social History Main Topics  . Smoking status: Current Every Day Smoker    Packs/day: 0.50    Years: 35.00    Types: Cigarettes  . Smokeless tobacco: Never Used     Comment: 8-10 cigarettes daily  . Alcohol use Yes     Comment: 1 liter per month / 48 oz beer every 2 weeks.  . Drug use: No     Comment: previous cocaine user long time ago  . Sexual activity: Yes

## 2016-12-19 ENCOUNTER — Encounter (INDEPENDENT_AMBULATORY_CARE_PROVIDER_SITE_OTHER): Payer: Self-pay | Admitting: Orthopedic Surgery

## 2016-12-19 ENCOUNTER — Ambulatory Visit (INDEPENDENT_AMBULATORY_CARE_PROVIDER_SITE_OTHER): Payer: BLUE CROSS/BLUE SHIELD | Admitting: Orthopedic Surgery

## 2016-12-19 VITALS — BP 138/82 | HR 63 | Ht 70.0 in | Wt 138.0 lb

## 2016-12-19 DIAGNOSIS — Z96641 Presence of right artificial hip joint: Secondary | ICD-10-CM

## 2016-12-19 DIAGNOSIS — M87052 Idiopathic aseptic necrosis of left femur: Secondary | ICD-10-CM

## 2016-12-19 NOTE — Progress Notes (Signed)
Office Visit Note   Patient: Trevor Mitchell           Date of Birth: [birth year]           MRN: [id number] Visit Date: 12/19/2016              Requested by: Esperanza Richters, PA-C [address] [place], [place] [address] PCP: Esperanza Richters, PA-C   Assessment & Plan: Visit Diagnoses:  1. Status post total replacement of right hip   2. Avascular necrosis of bone of hip, left (HCC)     Plan:  #1: Continue with activities as tolerated following hip precautions #2: Follow back up 4 weeks when he is 3 months postop  Follow-Up Instructions: Return in about 4 weeks (around 01/16/2017).   Orders:  No orders of the defined types were placed in this encounter.  No orders of the defined types were placed in this encounter.     Procedures: No procedures performed   Clinical Data: No additional findings.   Subjective: Chief Complaint  Patient presents with  . Follow-up    SURGERY R HIP    Trevor Mitchell is now close to 8 weeks status post right total hip arthroplasty with excellent results. He is ambulating well without any assisted devices. Not really having much in way of any pain. Denies any pain at this time in the left hip    Review of Systems  Constitutional: Negative for fatigue.  HENT: Negative for hearing loss.   Respiratory: Negative for apnea, chest tightness and shortness of breath.   Cardiovascular: Negative for chest pain, palpitations and leg swelling.  Gastrointestinal: Negative for blood in stool, constipation and diarrhea.  Genitourinary: Negative for difficulty urinating.  Musculoskeletal: Positive for back pain. Negative for arthralgias, joint swelling, myalgias, neck pain and neck stiffness.  Neurological: Negative for weakness, numbness and headaches.  Hematological: Does not bruise/bleed easily.  Psychiatric/Behavioral: Negative for sleep disturbance. The patient is not nervous/anxious.      Objective: Vital Signs: BP 138/82  (BP Location: Left Arm, Patient Position: Sitting, Cuff Size: Normal)   Pulse 63   Ht  (1.778 m)   Wt 138 lb (62.6 kg)   BMI 19.80 kg/m   Physical Exam  Constitutional: He is oriented to person, place, and time. He appears well-developed and well-nourished.  HENT:  Head: Normocephalic and atraumatic.  Eyes: Pupils are equal, round, and reactive to light. EOM are normal.  Pulmonary/Chest: Effort normal.  Neurological: He is alert and oriented to person, place, and time.  Skin: Skin is warm and dry.  Psychiatric: He has a normal mood and affect. His behavior is normal. Judgment and thought content normal.    Ortho Exam  Today he has smooth motion of both hips. Did not elicit much in the way of any pain now. Well-healed surgical incision on the right hip. Neurovascular intact distally.  Specialty Comments:  No specialty comments available.  Imaging: No results found.   PMFS History: Patient Active Problem List   Diagnosis Date Noted  . Avascular necrosis of hip, right (HCC) 10/23/2016  . S/P prosthetic total arthroplasty of the hip 10/23/2016   Past Medical History:  Diagnosis Date  . Arthritis   . Hypertension     Family History  Problem Relation Age of Onset  . High blood pressure Mother   . Diabetes Mother   . Asthma Sister   . Hypertension Sister   . Other Maternal Uncle  enlarged heart    Past Surgical History:  Procedure Laterality Date  . TOTAL HIP ARTHROPLASTY Right 10/23/2016   Procedure: RIGHT TOTAL HIP ARTHROPLASTY;  Surgeon: Valeria Batman, MD;  Location: [facility] OR;  Service: Orthopedics;  Laterality: Right;  . WISDOM TOOTH EXTRACTION     back tooth a long time ago   Social History   Occupational History  . Not on file.   Social History Main Topics  . Smoking status: Current Every Day Smoker    Packs/day: 0.50    Years: 35.00    Types: Cigarettes  . Smokeless tobacco: Never Used     Comment: 8-10 cigarettes daily  . Alcohol use Yes      Comment: 1 liter per month / 48 oz beer every 2 weeks.  . Drug use: No     Comment: previous cocaine user long time ago  . Sexual activity: Yes

## 2017-01-14 ENCOUNTER — Ambulatory Visit (INDEPENDENT_AMBULATORY_CARE_PROVIDER_SITE_OTHER): Payer: BLUE CROSS/BLUE SHIELD | Admitting: Orthopaedic Surgery

## 2017-01-14 VITALS — BP 150/85 | HR 66 | Resp 14 | Ht 70.0 in | Wt 145.0 lb

## 2017-01-14 DIAGNOSIS — Z96641 Presence of right artificial hip joint: Secondary | ICD-10-CM

## 2017-01-14 NOTE — Progress Notes (Signed)
Office Visit Note   Patient: Trevor Mitchell           Date of Birth: [birth year]           MRN: [id number] Visit Date: 01/14/2017              Requested by: Esperanza Richters, PA-C [address] [place], [place] [address] PCP: Esperanza Richters, PA-C   Assessment & Plan: Visit Diagnoses:  1. Status post right hip replacement     Plan: 3 months status post primary right total hip replacement and very happy with the results. No pain. 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 Discussed potential limitations. We'll give him a note saying he can return any time. Office 6 months. Discussed need for antibiotics with any invasive procedures  Follow-Up Instructions: Return in about 6 months (around 07/15/2017).   Orders:  No orders of the defined types were placed in this encounter.  No orders of the defined types were placed in this encounter.     Procedures: No procedures performed   Clinical Data: No additional findings.   Subjective: Chief Complaint  Patient presents with  . Right Hip - Routine Post Op    Mr. Trevor Mitchell is a 51 y o 7 weeks S/P Right THA. He relates through an interpreter that he is doing fine.  Nearly 3 months status post right total hip replacement for a combination of osteoarthritis and avascular necrosis. Doing well. He notes that he walks without a limp. 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 He does have avascular necrosis of his left hip but presently is asymptomatic  HPI  Review of Systems  Constitutional: Negative for fatigue.  HENT: Negative for hearing loss.   Respiratory: Negative for apnea, chest tightness and shortness of breath.   Cardiovascular: Negative for chest pain, palpitations and leg swelling.  Gastrointestinal: Negative for blood in stool, constipation and diarrhea.  Genitourinary: Negative for difficulty urinating.  Musculoskeletal: Negative for arthralgias, back pain,  joint swelling, myalgias, neck pain and neck stiffness.  Neurological: Negative for weakness, numbness and headaches.  Hematological: Does not bruise/bleed easily.  Psychiatric/Behavioral: Negative for sleep disturbance. The patient is not nervous/anxious.      Objective: Vital Signs: BP (!) 150/85   Pulse 66   Resp 14   Ht 5\' 10"  (1.778 m)   Wt 145 lb (65.8 kg)   BMI 20.81 kg/m   Physical Exam  Ortho Exam awake alert and oriented 3. Comfortable sitting. Tennis range of motion of right hip with internal/external rotation. No localized areas of tenderness. No edema distally. Lower leg lengths appear to be symmetrical. Neurovascular exam intact.  Specialty Comments:  No specialty comments available.  Imaging: No results found.   PMFS History: Patient Active Problem List   Diagnosis Date Noted  . Avascular necrosis of hip, right (HCC) 10/23/2016  . S/P prosthetic total arthroplasty of the hip 10/23/2016   Past Medical History:  Diagnosis Date  . Arthritis   . Hypertension     Family History  Problem Relation Age of Onset  . High blood pressure Mother   . Diabetes Mother   . Asthma Sister   . Hypertension Sister   . Other Maternal Uncle        enlarged heart    Past Surgical History:  Procedure Laterality Date  . TOTAL HIP ARTHROPLASTY Right 10/23/2016   Procedure: RIGHT TOTAL HIP ARTHROPLASTY;  Surgeon:  Valeria BatmanWhitfield, Evanee Lubrano W, MD;  Location: [facility] OR;  Service: Orthopedics;  Laterality: Right;  . WISDOM TOOTH EXTRACTION     back tooth a long time ago   Social History   Occupational History  . Not on file.   Social History Main Topics  . Smoking status: Current Every Day Smoker    Packs/day: 0.50    Years: 35.00    Types: Cigarettes  . Smokeless tobacco: Never Used     Comment: 8-10 cigarettes daily  . Alcohol use Yes     Comment: 1 liter per month / 48 oz beer every 2 weeks.  . Drug use: No     Comment: previous cocaine user long time ago  . Sexual activity:  Yes

## 2017-03-20 ENCOUNTER — Other Ambulatory Visit: Payer: Self-pay | Admitting: Medical

## 2017-03-26 ENCOUNTER — Telehealth: Payer: Self-pay | Admitting: Medical

## 2017-03-26 NOTE — Telephone Encounter (Signed)
Copied from CRM [id number]. Topic: Quick Communication - See Telephone Encounter >> Mar 26, 2017  3:25 PM Terisa Starraylor, Brittany L wrote: CRM for notification. See Telephone encounter for:   03/26/17.  losartan (COZAAR) 100 MG tablet  [facility] [address] [place], [place] - [id number]

## 2017-03-27 ENCOUNTER — Other Ambulatory Visit: Payer: Self-pay | Admitting: *Deleted

## 2017-03-27 MED ORDER — LOSARTAN POTASSIUM 100 MG PO TABS: 100.0000 mg | ORAL_TABLET | Freq: Every day | ORAL | 0 refills | Status: DC

## 2017-04-25 ENCOUNTER — Encounter (INDEPENDENT_AMBULATORY_CARE_PROVIDER_SITE_OTHER): Payer: Self-pay | Admitting: Orthopaedic Surgery

## 2017-04-25 ENCOUNTER — Ambulatory Visit (INDEPENDENT_AMBULATORY_CARE_PROVIDER_SITE_OTHER): Payer: BLUE CROSS/BLUE SHIELD | Admitting: Orthopaedic Surgery

## 2017-04-25 ENCOUNTER — Ambulatory Visit (INDEPENDENT_AMBULATORY_CARE_PROVIDER_SITE_OTHER): Payer: BLUE CROSS/BLUE SHIELD

## 2017-04-25 ENCOUNTER — Ambulatory Visit (INDEPENDENT_AMBULATORY_CARE_PROVIDER_SITE_OTHER): Payer: Self-pay

## 2017-04-25 VITALS — BP 153/86 | HR 67 | Resp 16 | Ht 69.0 in | Wt 150.0 lb

## 2017-04-25 DIAGNOSIS — M87052 Idiopathic aseptic necrosis of left femur: Secondary | ICD-10-CM | POA: Diagnosis not present

## 2017-04-25 DIAGNOSIS — M545 Low back pain: Secondary | ICD-10-CM | POA: Diagnosis not present

## 2017-04-25 DIAGNOSIS — Z96651 Presence of right artificial knee joint: Secondary | ICD-10-CM

## 2017-04-25 NOTE — Progress Notes (Signed)
Office Visit Note   Patient: Trevor Mitchell           Date of Birth: Oct 03, 1965           MRN: [id number] Visit Date: 04/25/2017              Requested by: Esperanza Richters, PA-C [address] [place], [place] [address] PCP: Esperanza Richters, PA-C   Assessment & Plan: Visit Diagnoses:  1. Acute left-sided low back pain, with sciatica presence unspecified   2. Total knee replacement status, right   3. Avascular necrosis of hip, left (HCC)     Plan:  #1: Physical therapy lumbar spine to include strengthening program #2: Advil 400 mg every 8 hours food anti-inflammatory #3: We will continue to watch his avascular necrosis on the left hip.  Follow-Up Instructions: Return in about 3 weeks (around 05/16/2017).   Face-to-face time spent with patient was greater than 25 minutes.  Greater than 50% of the time was spent in counseling and coordination of care.  A interpreter had to be used for the entire office visit.  We discussed with him the avascular necrosis in his left hip and what may happen in the future.  Also discussed with him his back pain and certainly with the type of job he has quit possibly continue.  Orders:  Orders Placed This Encounter  Procedures  . XR HIP UNILAT W OR W/O PELVIS 2-3 VIEWS LEFT  . XR Lumbar Spine 2-3 Views  . Ambulatory referral to Physical Therapy   No orders of the defined types were placed in this encounter.     Procedures: No procedures performed   Clinical Data: No additional findings.   Subjective: Chief Complaint  Patient presents with  . Lower Back - Pain, Weakness    Mr. Trevor Mitchell is a 52 y o  S/P approx. 1 year R RHA. No pain in the R  Hip. 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     HPI  Mr. Trevor Mitchell is a 52 year old who presents today now over 6 months status post aggravated his back.  He has pain mainly on the left side of his lumbar spine.  He denies any weakness at  this time.  Particular episode where this all started.  However because of his arduous job this is exacerbated symptoms to his lumbar spine.  He denies any radicular symptoms at this time.  In addition he states that he is starting to have some left groin symptoms especially when he coughs and tries to do certain maneuvers.  This is becoming a little bit more prominent than it was previously.  He does point to his groin when he talks about the pain.  A recent history of injury or trauma to this area.  Review of Systems  Constitutional: Negative for fatigue.  HENT: Negative for hearing loss.   Respiratory: Negative for apnea, chest tightness and shortness of breath.   Cardiovascular: Negative for chest pain, palpitations and leg swelling.  Gastrointestinal: Negative for blood in stool, constipation and diarrhea.  Genitourinary: Negative for difficulty urinating.  Musculoskeletal: Positive for back pain and neck stiffness. Negative for arthralgias, joint swelling, myalgias and neck pain.  Neurological: Negative for weakness, numbness and headaches.  Hematological: Does not bruise/bleed easily.  Psychiatric/Behavioral: Negative for sleep disturbance. The patient is not nervous/anxious.      Objective: Vital Signs: BP (!) 153/86   Pulse 67   Resp 16  Ht 5\' 9"  (1.753 m)   Wt 150 lb (68 kg)   BMI 22.15 kg/m   Physical Exam  Constitutional: He is oriented to person, place, and time. He appears well-developed and well-nourished.  HENT:  Head: Normocephalic and atraumatic.  Eyes: EOM are normal. Pupils are equal, round, and reactive to light.  Pulmonary/Chest: Effort normal.  Neurological: He is alert and oriented to person, place, and time.  Skin: Skin is warm and dry.  Psychiatric: He has a normal mood and affect. His behavior is normal. Judgment and thought content normal.    Ortho Exam  Today he has forward flexion to about 1 foot off of the floor with his hands.  This only gives  him some pain in his back.  Backward extension on the other hand is about 20 degrees he starts to cry of pain in the low back.  Twisting as well as lateral flexion does cause him pain on the left side.  Denies any radicular type symptoms with maneuvers.  Deep tendon reflexes 2+ in the knees 2+ on the right Achilles and 1+ on the left Achilles.  He has equal strength bilaterally.  States some decreased sensation along the lateral aspect of his calf when testing.  Straight leg raising.  Specialty Comments:  No specialty comments available.  Imaging: Xr Hip Unilat W Or W/o Pelvis 2-3 Views Left  Result Date: 04/25/2017 Unilateral left hip with AP pelvis reveals the right hip to be in excellent position and alignment.  No radiolucencies or changes.  The left hip does reveal early avascular necrosis with cystic changes.  This is a concern to be seen on the frog-leg lateral.  Xr Lumbar Spine 2-3 Views  Result Date: 04/25/2017 2 view lumbar spine reveals some facet changes at L4-5 and L5-S1.  Right superior right lateral spurring is noted at L3.    PMFS History: Patient Active Problem List   Diagnosis Date Noted  . Avascular necrosis of hip, right (HCC) 10/23/2016  . S/P prosthetic total arthroplasty of the hip 10/23/2016   Past Medical History:  Diagnosis Date  . Arthritis   . Hypertension     Family History  Problem Relation Age of Onset  . High blood pressure Mother   . Diabetes Mother   . Asthma Sister   . Hypertension Sister   . Other Maternal Uncle        enlarged heart    Past Surgical History:  Procedure Laterality Date  . TOTAL HIP ARTHROPLASTY Right 10/23/2016   Procedure: RIGHT TOTAL HIP ARTHROPLASTY;  Surgeon: Valeria BatmanWhitfield, Peter W, MD;  Location: [facility] OR;  Service: Orthopedics;  Laterality: Right;  . WISDOM TOOTH EXTRACTION     back tooth a long time ago   Social History   Occupational History  . Not on file  Tobacco Use  . Smoking status: Current Every Day Smoker     Packs/day: 0.50    Years: 35.00    Pack years: 17.50    Types: Cigarettes  . Smokeless tobacco: Never Used  . Tobacco comment: 8-10 cigarettes daily  Substance and Sexual Activity  . Alcohol use: Yes    Comment: 1 liter per month / 48 oz beer every 2 weeks.  . Drug use: No    Comment: previous cocaine user long time ago  . Sexual activity: Yes

## 2017-05-20 ENCOUNTER — Telehealth (INDEPENDENT_AMBULATORY_CARE_PROVIDER_SITE_OTHER): Payer: Self-pay | Admitting: Orthopaedic Surgery

## 2017-05-20 NOTE — Telephone Encounter (Signed)
Patient called stating that he has not been set up with physical therapy yet.  Patient was confused about scheduling the appointment and where he was referred to go.

## 2017-05-21 NOTE — Telephone Encounter (Signed)
Called and left message for patient to call [facility] to [facility] up thearpy at [phone]

## 2017-06-10 ENCOUNTER — Ambulatory Visit: Payer: BLUE CROSS/BLUE SHIELD | Attending: Orthopaedic Surgery | Admitting: Physical Therapy

## 2017-07-12 ENCOUNTER — Ambulatory Visit (INDEPENDENT_AMBULATORY_CARE_PROVIDER_SITE_OTHER): Payer: BLUE CROSS/BLUE SHIELD | Admitting: Orthopaedic Surgery

## 2017-07-12 ENCOUNTER — Encounter (INDEPENDENT_AMBULATORY_CARE_PROVIDER_SITE_OTHER): Payer: Self-pay | Admitting: Orthopaedic Surgery

## 2017-07-12 ENCOUNTER — Other Ambulatory Visit (INDEPENDENT_AMBULATORY_CARE_PROVIDER_SITE_OTHER): Payer: Self-pay | Admitting: Radiology

## 2017-07-12 VITALS — BP 171/116 | HR 61 | Resp 16 | Ht 69.0 in | Wt 147.0 lb

## 2017-07-12 DIAGNOSIS — M545 Low back pain, unspecified: Secondary | ICD-10-CM

## 2017-07-12 DIAGNOSIS — G8929 Other chronic pain: Secondary | ICD-10-CM

## 2017-07-12 DIAGNOSIS — M79602 Pain in left arm: Secondary | ICD-10-CM

## 2017-07-12 MED ORDER — METHOCARBAMOL 500 MG PO TABS: ORAL_TABLET | ORAL | 0 refills | Status: AC

## 2017-07-12 NOTE — Progress Notes (Signed)
Office Visit Note   Patient: Trevor Mitchell           Date of Birth: 04/08/1965           MRN: [id number] Visit Date: 07/12/2017              Requested by: Esperanza RichtersSaguier, Edward, PA-C [address] [place], [place] [id number] PCP: Esperanza RichtersSaguier, Edward, PA-C   Assessment & Plan: Visit Diagnoses:  1. Chronic bilateral low back pain without sciatica   2. Chronic pain of left upper extremity     Plan: Double problems discussed today.  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  He has the same pathology on the left and realizes that at some point he needs to have that replaced but is "not ready".  He has not worked since before his surgery and has applied for disability.  Also has had chronic problems with his back with's some discomfort referred to both buttocks.  He also mentioned a problem is having with his left upper extremity on a chronic basis with atrophy.  He is left-hand nondominant.  Valuation is somewhat compromised speak much [place].  There was an interpreter  Today.  I will add Robaxin to his Advil for the chronic back pain and order EMGs nerve conduction studies to the left upper extremity Follow-Up Instructions: Return after EMG's.   Orders:  No orders of the defined types were placed in this encounter.  No orders of the defined types were placed in this encounter.     Procedures: No procedures performed   Clinical Data: No additional findings.   Subjective: Chief Complaint  Patient presents with  . Left Hip - Follow-up, Pain  . Follow-up    l hip having some pain, backpain and having numbness in tailbone, r hip doing well  Trevor Mitchell is status post primary right total hip replacement in July 2018 and from that standpoint doing very well.  He does have avascular necrosis of the left hip which is somewhat symptomatic.  He is not ready for hip he also has been followed for the chronic low back pain.   We had ordered physical therapy but he has not yet attended session.scheduled for the end of the month.  He is having back pain when he stands from at the time and some pain into both lower extremities which may or may not be referred but rather from his hips.  He also mentioned that for at least 3 years he has had a difference in appearance with his right dominant arm compared to the left.  He is had atrophy on the left.  Has had some neck pain.  HPI  Review of Systems  Constitutional: Negative for fatigue.  HENT: Negative for ear pain.   Eyes: Negative for pain.  Respiratory: Negative for cough.   Cardiovascular: Negative for leg swelling.  Gastrointestinal: Negative for constipation.  Genitourinary: Negative for difficulty urinating.  Musculoskeletal: Positive for back pain.  Skin: Negative for rash.  Allergic/Immunologic: Negative for food allergies.  Neurological: Positive for weakness and numbness.  Hematological: Does not bruise/bleed easily.  Psychiatric/Behavioral: Negative for sleep disturbance.     Objective: Vital Signs: BP (!) 171/116 (BP Location: Left Arm, Patient Position: Sitting, Cuff Size: Normal)   Pulse 61   Resp 16   Ht 5\' 9"  (1.753 m)   Wt 147 lb (66.7 kg)   BMI 21.71 kg/m   Physical  Exam  Constitutional: He is oriented to person, place, and time. He appears well-developed and well-nourished.  Eyes: Pupils are equal, round, and reactive to light. EOM are normal.  Pulmonary/Chest: Effort normal.  Neurological: He is alert and oriented to person, place, and time.  Skin: Skin is warm and dry.  Psychiatric: He has a normal mood and affect. His behavior is normal.    Ortho Exam awake alert and oriented x3.  Comfortable sitting.  Does have a limp referable to his left hip.  Painless range of motion with his right hip replacement.  Very mild pain on the extremes of internal/external rotation on the left.  Straight leg raise negative bilaterally.  Reflexes appear  to be symmetrical.  Leg lengths are symmetrical.  Mild percussible tenderness lower lumbar spine.  Prior films reveal some facet changes at L4-5 and L5-S1.  Neurologically appears to be intact.  There is definite atrophy of his left arm compared to the right.  He has good grip and good release.  No sensory changes.  Not having any significant pain with range of motion of his shoulder.  Some mild discomfort with motion of his neck but no referred pain to either upper extremity.  Specialty Comments:  No specialty comments available.  Imaging: No results found.   PMFS History: Patient Active Problem List   Diagnosis Date Noted  . Avascular necrosis of hip, right (HCC) 10/23/2016  . S/P prosthetic total arthroplasty of the hip 10/23/2016   Past Medical History:  Diagnosis Date  . Arthritis   . Hypertension     Family History  Problem Relation Age of Onset  . High blood pressure Mother   . Diabetes Mother   . Asthma Sister   . Hypertension Sister   . Other Maternal Uncle        enlarged heart    Past Surgical History:  Procedure Laterality Date  . TOTAL HIP ARTHROPLASTY Right 10/23/2016   Procedure: RIGHT TOTAL HIP ARTHROPLASTY;  Surgeon: Valeria Batman, MD;  Location: [facility] OR;  Service: Orthopedics;  Laterality: Right;  . WISDOM TOOTH EXTRACTION     back tooth a long time ago   Social History   Occupational History  . Not on file  Tobacco Use  . Smoking status: Current Every Day Smoker    Packs/day: 0.50    Years: 35.00    Pack years: 17.50    Types: Cigarettes  . Smokeless tobacco: Never Used  . Tobacco comment: 8-10 cigarettes daily  Substance and Sexual Activity  . Alcohol use: Yes    Comment: 1 liter per month / 48 oz beer every 2 weeks.  . Drug use: No    Comment: previous cocaine user long time ago  . Sexual activity: Yes

## 2017-07-16 ENCOUNTER — Ambulatory Visit: Payer: BLUE CROSS/BLUE SHIELD | Attending: Orthopaedic Surgery | Admitting: Physical Therapy

## 2017-07-16 ENCOUNTER — Other Ambulatory Visit: Payer: Self-pay

## 2017-07-16 ENCOUNTER — Encounter: Payer: Self-pay | Admitting: Physical Therapy

## 2017-07-16 DIAGNOSIS — M62830 Muscle spasm of back: Secondary | ICD-10-CM | POA: Insufficient documentation

## 2017-07-16 DIAGNOSIS — M5441 Lumbago with sciatica, right side: Secondary | ICD-10-CM | POA: Diagnosis present

## 2017-07-16 NOTE — Therapy (Signed)
[facility] [place] [address] [place], [place], [address] Phone: [phone]   Fax:  [phone]  Physical Therapy Evaluation  Patient Details  Name: Trevor Mitchell MRN: [id number] Date of Birth: [birth year] Referring Provider: Cleophas Dunker   Encounter Date: 07/16/2017  PT End of Session - 07/16/17 0832    Visit Number  1    Date for PT Re-Evaluation  09/15/17    PT Start Time  0810    PT Stop Time  0850    PT Time Calculation (min)  40 min    Activity Tolerance  Patient tolerated treatment well    Behavior During Therapy  [facility] for tasks assessed/performed       Past Medical History:  Diagnosis Date  . Arthritis   . Hypertension     Past Surgical History:  Procedure Laterality Date  . TOTAL HIP ARTHROPLASTY Right 10/23/2016   Procedure: RIGHT TOTAL HIP ARTHROPLASTY;  Surgeon: Valeria Batman, MD;  Location: [facility] OR;  Service: Orthopedics;  Laterality: Right;  . WISDOM TOOTH EXTRACTION     back tooth a long time ago    There were no vitals filed for this visit.   Subjective Assessment - 07/16/17 0810    Subjective  Patient reports back and hip pain with standing or lifting.  Reports that he has been having some pain for about 5-6 months.  X-rays show some degenerative changes and spurring in the low back as well as some AVN beginning in the left hip.    Pertinent History  right THR last year    Limitations  Lifting;Standing;House hold activities;Walking    How long can you stand comfortably?  hour    Patient Stated Goals  have less pain    Currently in Pain?  Yes    Pain Score  7     Pain Location  Back    Pain Orientation  Lower;Right    Pain Descriptors / Indicators  Aching    Pain Type  Acute pain    Pain Radiating Towards  into the right buttock    Pain Onset  More than a month ago    Pain Frequency  Constant    Aggravating Factors   standing, lifting bending 8-9/10    Pain Relieving Factors   medicine and rest pain can be 1/10    Effect of Pain on Daily Activities  limits activity         [facility] PT Assessment - 07/16/17 0001      Assessment   Medical Diagnosis  LBP with sciatica    Referring Provider  [place]    Onset Date/Surgical Date  06/15/17    Prior Therapy  no      Precautions   Precautions  None      Balance Screen   Has the patient fallen in the past 6 months  No    Has the patient had a decrease in activity level because of a fear of falling?   No    Is the patient reluctant to leave their home because of a fear of falling?   No      Home Environment   Additional Comments  no stairs, does some yardwork      Prior Function   Level of Independence  Independent    Vocation  Unemployed    Environmental manager    Leisure  no exercise      Posture/Postural Control   Posture  Comments  fwd head, rounded shoulders      ROM / Strength   AROM / PROM / Strength  AROM;Strength      AROM   Overall AROM Comments  Lumbar ROM decreased 50% with c/o pain in the back and the right buttock      Strength   Overall Strength Comments  4-/5 with pain in the hips and the low back      Flexibility   Soft Tissue Assessment /Muscle Length  yes    Hamstrings  very tight SLR to 45 degrees    Piriformis  very tight and painful      Palpation   Palpation comment  lumbar mms tight and tender, mostly in the right buttock                Objective measurements completed on examination: See above findings.      [facility] Adult PT Treatment/Exercise - 07/16/17 0001      Modalities   Modalities  Electrical Stimulation;Moist Heat      Moist Heat Therapy   Number Minutes Moist Heat  15 Minutes    Moist Heat Location  Lumbar Spine      Electrical Stimulation   Electrical Stimulation Location  L/S area    Electrical Stimulation Action  IFC    Electrical Stimulation Parameters  supine    Electrical Stimulation Goals  Pain             PT  Education - 07/16/17 0831    Education provided  Yes    Education Details  Gentle supine Lumbar flexion with cues for him to not go past 90 degrees hip flexion    Person(s) Educated  Patient    Methods  Explanation;Demonstration;Handout    Comprehension  Verbalized understanding       PT Short Term Goals - 07/16/17 0835      PT SHORT TERM GOAL #1   Title  independent with initial HEP    Time  2    Period  Weeks    Status  New        PT Long Term Goals - 07/16/17 4098      PT LONG TERM GOAL #1   Title  decrease pain 50%    Time  8    Period  Weeks    Status  New      PT LONG TERM GOAL #2   Title  increase lumbar ROM 25%    Time  8    Period  Weeks    Status  New      PT LONG TERM GOAL #3   Title  understand posture and body mechanics    Time  8    Period  Weeks    Status  New      PT LONG TERM GOAL #4   Title  increasre SLR to 70 degrees    Time  8    Period  Weeks    Status  New             Plan - 07/16/17 1191    Clinical Impression Statement  Patient reports 5-6 months of LBP with some sciatica on the right worse.  He had a right THR last year due to AVN.  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  He is very tight in the LE mms, his lumbar ROM was decreased  50%    History and Personal Factors relevant to plan of care:  past right THR last year and the start of AVN in the left hip    Clinical Presentation  Evolving    Clinical Decision Making  Low    Rehab Potential  Fair    Clinical Impairments Affecting Rehab Potential  AVN in the left hip and recent right THR    PT Frequency  2x / week    PT Duration  8 weeks    PT Treatment/Interventions  ADLs/Self Care Home Management;Cryotherapy;Electrical Stimulation;Traction;Moist Heat;Therapeutic exercise;Therapeutic activities;Functional mobility training;Patient/family education;Manual techniques    PT Next Visit Plan  slowly  start core stability and LE flexibility    Consulted and Agree with Plan of Care  Patient       Patient will benefit from skilled therapeutic intervention in order to improve the following deficits and impairments:  Abnormal gait, Decreased range of motion, Increased muscle spasms, Pain, Improper body mechanics, Impaired flexibility, Postural dysfunction, Decreased strength  Visit Diagnosis: Acute bilateral low back pain with right-sided sciatica - Plan: PT plan of care cert/re-cert  Muscle spasm of back - Plan: PT plan of care cert/re-cert     Problem List Patient Active Problem List   Diagnosis Date Noted  . Avascular necrosis of hip, right (HCC) 10/23/2016  . 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 [place], [place], [address] Phone: [phone]   Fax:  [phone]  Name: Trevor Mitchell MRN: [id number] Date of Birth: 13-Mar-1966

## 2017-07-17 ENCOUNTER — Ambulatory Visit: Payer: BLUE CROSS/BLUE SHIELD | Admitting: Medical

## 2017-07-18 ENCOUNTER — Ambulatory Visit (INDEPENDENT_AMBULATORY_CARE_PROVIDER_SITE_OTHER): Payer: BLUE CROSS/BLUE SHIELD | Admitting: Medical

## 2017-07-18 ENCOUNTER — Encounter: Payer: Self-pay | Admitting: Medical

## 2017-07-18 ENCOUNTER — Other Ambulatory Visit: Payer: Self-pay | Admitting: Medical

## 2017-07-18 VITALS — BP 145/95 | HR 69 | Temp 98.1°F | Resp 16 | Ht 69.0 in | Wt 145.0 lb

## 2017-07-18 DIAGNOSIS — M25551 Pain in right hip: Secondary | ICD-10-CM

## 2017-07-18 DIAGNOSIS — M25552 Pain in left hip: Secondary | ICD-10-CM | POA: Diagnosis not present

## 2017-07-18 DIAGNOSIS — I10 Essential (primary) hypertension: Secondary | ICD-10-CM

## 2017-07-18 MED ORDER — TRAMADOL HCL 50 MG PO TABS: ORAL_TABLET | ORAL | 0 refills | Status: DC

## 2017-07-18 NOTE — Patient Instructions (Signed)
Your blood pressure is mildly elevated presently.  But it was very high the other day at orthopedist.  Some of this elevation may have been related to the severe pain that you were experiencing.  I want you to start checking your blood pressures daily and call us in 1 week for update on blood pressure readings.  If the majority of your blood pressure readings are 140/90 then we will need to add another blood pressure medication to your current losartan.  Very important that you continue losartan.  For the hip pain, I want you to avoid any NSAIDs over-the-counter.  As I explained they can increase her blood pressure slightly.  For mild to moderate pain he can use Tylenol over-the-counter.  For more severe pain I am making a 5-day prescription of tramadol available.  If over the next 5 days you feel like this regimen is controlling your pain then let me know and we would have you follow-up to sign controlled medication contract and give UDS.  Note regarding blood pressure checks, if you have severe high blood pressure reading such as 170/116 as you had the other day in the orthopedist office then go ahead and call sooner than 1 week as in that event I would go ahead and add additional blood pressure medication.  Follow-up in 7 days or as needed.

## 2017-07-18 NOTE — Progress Notes (Signed)
Subjective:    Patient ID: Trevor Mitchell, male    DOB: [birth year], 52 y.o.   MRN: [id number]  HPI  Pt in for follow up.  Seeing PT for post surgical hip pain. Pt not been working.  Pt in with high bp. Pt bp on 07/13/2107 very high the other day near 170/116. This was at his ortho office. But he was in moderate-severe pain. Ortho did write him robaxin. No pain meds recently. Pain level is about 4-5/10 daily. If he stands for long time has pain.  Pt has hx of htn. Has been on losartan daily.    Review of Systems  Constitutional: Negative for chills, fatigue and fever.  Respiratory: Negative for cough, chest tightness, shortness of breath and wheezing.   Cardiovascular: Negative for chest pain and palpitations.  Musculoskeletal: Negative for back pain.  Skin: Negative for rash.  Neurological: Negative for dizziness, speech difficulty, weakness, numbness and headaches.  Hematological: Negative for adenopathy. Does not bruise/bleed easily.  Psychiatric/Behavioral: Negative for agitation, confusion, hallucinations and suicidal ideas. The patient is not nervous/anxious.     Past Medical History:  Diagnosis Date  . Arthritis   . Hypertension      Social History   Socioeconomic History  . Marital status: Married    Spouse name: Not on file  . Number of children: Not on file  . Years of education: Not on file  . Highest education level: Not on file  Occupational History  . Not on file  Social Needs  . Financial resource strain: Not on file  . Food insecurity:    Worry: Not on file    Inability: Not on file  . Transportation needs:    Medical: Not on file    Non-medical: Not on file  Tobacco Use  . Smoking status: Current Every Day Smoker    Packs/day: 0.50    Years: 35.00    Pack years: 17.50    Types: Cigarettes  . Smokeless tobacco: Never Used  . Tobacco comment: 8-10 cigarettes daily  Substance and Sexual Activity  . Alcohol use: Yes    Comment: 1  liter per month / 48 oz beer every 2 weeks.  . Drug use: No    Comment: previous cocaine user long time ago  . Sexual activity: Yes  Lifestyle  . Physical activity:    Days per week: Not on file    Minutes per session: Not on file  . Stress: Not on file  Relationships  . Social connections:    Talks on phone: Not on file    Gets together: Not on file    Attends religious service: Not on file    Active member of club or organization: Not on file    Attends meetings of clubs or organizations: Not on file    Relationship status: Not on file  . Intimate partner violence:    Fear of current or ex partner: Not on file    Emotionally abused: Not on file    Physically abused: Not on file    Forced sexual activity: Not on file  Other Topics Concern  . Not on file  Social History Narrative  . Not on file    Past Surgical History:  Procedure Laterality Date  . TOTAL HIP ARTHROPLASTY Right 10/23/2016   Procedure: RIGHT TOTAL HIP ARTHROPLASTY;  Surgeon: Valeria Batman, MD;  Location: [facility] OR;  Service: Orthopedics;  Laterality: Right;  . WISDOM TOOTH EXTRACTION  back tooth a long time ago    Family History  Problem Relation Age of Onset  . High blood pressure Mother   . Diabetes Mother   . Asthma Sister   . Hypertension Sister   . Other Maternal Uncle        enlarged heart    Allergies  Allergen Reactions  . No Known Allergies     Current Outpatient Medications on File Prior to Visit  Medication Sig Dispense Refill  . losartan (COZAAR) 100 MG tablet Take 1 tablet (100 mg total) by mouth daily. 90 tablet 0  . methocarbamol (ROBAXIN) 500 MG tablet I po bid prn 30 tablet 0   No current facility-administered medications on file prior to visit.     BP (!) 145/95 (BP Location: Right Arm, Patient Position: Sitting, Cuff Size: Small)   Pulse 69   Temp 98.1 F (36.7 C) (Oral)   Resp 16   Ht  (1.753 m)   Wt 145 lb (65.8 kg)   SpO2 99%   BMI 21.41 kg/m         Objective:   Physical Exam  General Mental Status- Alert. General Appearance- Not in acute distress.   Skin General: Color- Normal Color. Moisture- Normal Moisture.  Neck Carotid Arteries- Normal color. Moisture- Normal Moisture. No carotid bruits. No JVD.  Chest and Lung Exam Auscultation: Breath Sounds:-Normal.  Cardiovascular Auscultation:Rythm- Regular. Murmurs & Other Heart Sounds:Auscultation of the heart reveals- No Murmurs.  Abdomen Inspection:-Inspeection Normal. Palpation/Percussion:Note:No mass. Palpation and Percussion of the abdomen reveal- Non Tender, Non Distended + BS, no rebound or guarding.  Neurologic Cranial Nerve exam:- CN III-XII intact(No nystagmus), symmetric smile. Strength:- 5/5 equal and symmetric strength both upper and lower extremities.  Left hip- moderate pain on range of motion. Right hip-less pain on range of motion compared to left.      Assessment & Plan:  Your blood pressure is mildly elevated presently.  But it was very high the other day at orthopedist.  Some of this elevation may have been related to the severe pain that you were experiencing.  I want you to start checking your blood pressures daily and call us in 1 week for update on blood pressure readings.  If the majority of your blood pressure readings are 140/90 then we will need to add another blood pressure medication to your current losartan.  Very important that you continue losartan.  For the hip pain, I want you to avoid any NSAIDs over-the-counter.  As I explained they can increase her blood pressure slightly.  For mild to moderate pain he can use Tylenol over-the-counter.  For more severe pain I am making a 5-day prescription of tramadol available.  If over the next 5 days you feel like this regimen is controlling your pain then let me know and we would have you follow-up to sign controlled medication contract and give UDS.  Note regarding blood pressure checks, if you have  severe high blood pressure reading such as 170/116 as you had the other day in the orthopedist office then go ahead and call sooner than 1 week as in that event I would go ahead and add additional blood pressure medication.  Follow-up in 7 days or as needed.  Esperanza Richters, PA-C

## 2017-07-19 ENCOUNTER — Other Ambulatory Visit: Payer: Self-pay

## 2017-07-19 ENCOUNTER — Encounter: Payer: Self-pay | Admitting: Physical Therapy

## 2017-07-19 ENCOUNTER — Telehealth: Payer: Self-pay | Admitting: Medical

## 2017-07-19 ENCOUNTER — Ambulatory Visit: Payer: BLUE CROSS/BLUE SHIELD | Attending: Orthopaedic Surgery | Admitting: Physical Therapy

## 2017-07-19 DIAGNOSIS — M5441 Lumbago with sciatica, right side: Secondary | ICD-10-CM | POA: Diagnosis present

## 2017-07-19 DIAGNOSIS — M62830 Muscle spasm of back: Secondary | ICD-10-CM | POA: Insufficient documentation

## 2017-07-19 MED ORDER — LOSARTAN POTASSIUM 100 MG PO TABS: 100.0000 mg | ORAL_TABLET | Freq: Every day | ORAL | 0 refills | Status: DC

## 2017-07-19 NOTE — Therapy (Signed)
[facility] [place] [address] [place], [place], [address] Phone: [phone]   Fax:  [phone]  Physical Therapy Treatment  Patient Details  Name: Trevor Mitchell MRN: [id number] Date of Birth: 12-May-1965 Referring Provider: Cleophas Dunker   Encounter Date: 07/19/2017  PT End of Session - 07/19/17 1041    Visit Number  2    PT Start Time  0845    PT Stop Time  0945    PT Time Calculation (min)  60 min    Activity Tolerance  Patient tolerated treatment well    Behavior During Therapy  [facility] for tasks assessed/performed       Past Medical History:  Diagnosis Date   Arthritis    Hypertension     Past Surgical History:  Procedure Laterality Date   TOTAL HIP ARTHROPLASTY Right 10/23/2016   Procedure: RIGHT TOTAL HIP ARTHROPLASTY;  Surgeon: Valeria Batman, MD;  Location: [facility] OR;  Service: Orthopedics;  Laterality: Right;   WISDOM TOOTH EXTRACTION     back tooth a long time ago    There were no vitals filed for this visit.  Subjective Assessment - 07/19/17 0850    Subjective  No interpreter available today.  Back is feeling so so, Left hip is hurting a bit more.  Is particularly painful with bending and lifting.                         [facility] Adult PT Treatment/Exercise - 07/19/17 0001      Exercises   Exercises  Lumbar      Lumbar Exercises: Stretches   Active Hamstring Stretch  Left;Right;3 reps;30 seconds    Single Knee to Chest Stretch  Right;Left;5 reps;10 seconds    Single Knee to Chest Stretch Limitations  Limit hip flexionto 90* d/t AVN Left hip    Double Knee to Chest Stretch  5 reps;10 seconds    Lower Trunk Rotation  5 reps;10 seconds    Pelvic Tilt  10 reps;5 seconds      Lumbar Exercises: Seated   Other Seated Lumbar Exercises  flexion in sitting 10x5"      Lumbar Exercises: Supine   Bridge  10 reps;5 seconds    Bridge with Ball Squeeze  10 reps;5 seconds    Other  Supine Lumbar Exercises  PPT w/ march 20x B      Modalities   Modalities  Electrical Stimulation;Moist Heat      Moist Heat Therapy   Number Minutes Moist Heat  15 Minutes    Moist Heat Location  Lumbar Spine      Electrical Stimulation   Electrical Stimulation Location  L/S area    Electrical Stimulation Action  IFC    Electrical Stimulation Parameters  supine    Electrical Stimulation Goals  Pain               PT Short Term Goals - 07/16/17 0835      PT SHORT TERM GOAL #1   Title  independent with initial HEP    Time  2    Period  Weeks    Status  New        PT Long Term Goals - 07/16/17 6962      PT LONG TERM GOAL #1   Title  decrease pain 50%    Time  8    Period  Weeks    Status  New  PT LONG TERM GOAL #2   Title  increase lumbar ROM 25%    Time  8    Period  Weeks    Status  New      PT LONG TERM GOAL #3   Title  understand posture and body mechanics    Time  8    Period  Weeks    Status  New      PT LONG TERM GOAL #4   Title  increasre SLR to 70 degrees    Time  8    Period  Weeks    Status  New            Plan - 07/19/17 1041    Clinical Impression Statement  No interpreter today.  Appears to have tolerated treatment well today.  Modify L/S flexion to avoid increased pressure on Lef thip d/t AVN.         Patient will benefit from skilled therapeutic intervention in order to improve the following deficits and impairments:     Visit Diagnosis: Acute bilateral low back pain with right-sided sciatica  Muscle spasm of back     Problem List Patient Active Problem List   Diagnosis Date Noted   Avascular necrosis of hip, right (HCC) 10/23/2016   S/P prosthetic total arthroplasty of the hip 10/23/2016    Tomie China, PTA 07/19/2017, 10:59 AM  [facility] [place] [address] [place], [place], [address] Phone: [phone]   Fax:  [phone]  Name: Trevor Mitchell MRN: [id number] Date of Birth: 1965-09-08

## 2017-07-19 NOTE — Telephone Encounter (Signed)
Copied from CRM [id number]. Topic: Quick Communication - Rx Refill/Question >> Jul 19, 2017 10:09 AM Louie Bun, Rosey Bath D wrote: Medication: losartan (COZAAR) 100 MG tablet Has the patient contacted their pharmacy? Yes (Agent: If no, request that the patient contact the pharmacy for the refill.) Preferred Pharmacy (with phone number or street name): [facility] - [place], [place] - [address] Agent: Please be advised that RX refills may take up to 3 business days. We ask that you follow-up with your pharmacy.

## 2017-07-23 ENCOUNTER — Ambulatory Visit: Payer: BLUE CROSS/BLUE SHIELD | Admitting: Physical Therapy

## 2017-07-23 ENCOUNTER — Encounter: Payer: Self-pay | Admitting: Physical Therapy

## 2017-07-23 DIAGNOSIS — M5441 Lumbago with sciatica, right side: Secondary | ICD-10-CM

## 2017-07-23 DIAGNOSIS — M62830 Muscle spasm of back: Secondary | ICD-10-CM

## 2017-07-23 NOTE — Therapy (Signed)
[facility] [address] [place], [place], [address] Phone: [phone]   Fax:  [phone]  Physical Therapy Treatment  Patient Details  Name: Trevor Mitchell MRN: [id number] Date of Birth: 01-11-1966 Referring Provider: Cleophas Dunker   Encounter Date: 07/23/2017  PT End of Session - 07/23/17 0919    Visit Number  3    Date for PT Re-Evaluation  09/15/17    PT Start Time  0840    PT Stop Time  0916    PT Time Calculation (min)  36 min    Activity Tolerance  Patient tolerated treatment well    Behavior During Therapy  [facility] for tasks assessed/performed       Past Medical History:  Diagnosis Date  . Arthritis   . Hypertension     Past Surgical History:  Procedure Laterality Date  . TOTAL HIP ARTHROPLASTY Right 10/23/2016   Procedure: RIGHT TOTAL HIP ARTHROPLASTY;  Surgeon: Valeria Batman, MD;  Location: [facility] OR;  Service: Orthopedics;  Laterality: Right;  . WISDOM TOOTH EXTRACTION     back tooth a long time ago    There were no vitals filed for this visit.  Subjective Assessment - 07/23/17 0840    Subjective  No interpreter toady. "I feel so so" Pt stated that he needed to finish in a short time today.    Currently in Pain?  No/denies    Pain Score  0-No pain                       [facility] Adult PT Treatment/Exercise - 07/23/17 0001      Lumbar Exercises: Stretches   Active Hamstring Stretch  Left;Right;3 reps;30 seconds    Single Knee to Chest Stretch  5 reps;Left;Right;10 seconds    Double Knee to Chest Stretch  5 reps;10 seconds    Lower Trunk Rotation  4 reps;10 seconds    Piriformis Stretch  Left;5 reps;10 seconds      Lumbar Exercises: Aerobic   Recumbent Bike  L0 x      Lumbar Exercises: Standing   Row  15 reps;Theraband;Both    Theraband Level (Row)  Level 3 (Green)    Shoulder Extension  Strengthening;15 reps;Theraband      Lumbar Exercises: Supine   Bridge  10  reps;5 seconds;2 seconds x2    Bridge with [employer]  10 reps;5 seconds;2 seconds x2               PT Short Term Goals - 07/16/17 0835      PT SHORT TERM GOAL #1   Title  independent with initial HEP    Time  2    Period  Weeks    Status  New        PT Long Term Goals - 07/16/17 3474      PT LONG TERM GOAL #1   Title  decrease pain 50%    Time  8    Period  Weeks    Status  New      PT LONG TERM GOAL #2   Title  increase lumbar ROM 25%    Time  8    Period  Weeks    Status  New      PT LONG TERM GOAL #3   Title  understand posture and body mechanics    Time  8    Period  Weeks    Status  New  PT LONG TERM GOAL #4   Title  increasre SLR to 70 degrees    Time  8    Period  Weeks    Status  New            Plan - 07/23/17 0919    Clinical Impression Statement  Pt stated that he needed to leave early, interpreter shoed ~ 10 min into treatment. Bilat HS tightness noted with MT. No issues with supine exercises. Postural cues needed with standing rows and extensions.  Modified Le flexion stretched due to AVN    Rehab Potential  Fair    Clinical Impairments Affecting Rehab Potential  AVN in the left hip and recent right THR    PT Frequency  2x / week    PT Duration  8 weeks    PT Treatment/Interventions  ADLs/Self Care Home Management;Cryotherapy;Electrical Stimulation;Traction;Moist Heat;Therapeutic exercise;Therapeutic activities;Functional mobility training;Patient/family education;Manual techniques    PT Next Visit Plan  slowly start core stability and LE flexibility       Patient will benefit from skilled therapeutic intervention in order to improve the following deficits and impairments:  Abnormal gait, Decreased range of motion, Increased muscle spasms, Pain, Improper body mechanics, Impaired flexibility, Postural dysfunction, Decreased strength  Visit Diagnosis: Muscle spasm of back  Acute bilateral low back pain with right-sided  sciatica     Problem List Patient Active Problem List   Diagnosis Date Noted  . Avascular necrosis of hip, right ([facility]) 10/23/2016  . S/P prosthetic total arthroplasty of the hip 10/23/2016    Grayce Sessions, PTA 07/23/2017, 9:23 AM  [facility] [address] [place], [place], [address] Phone: [phone]   Fax:  [phone]  Name: Trevor Mitchell MRN: [id number] Date of Birth: 02-Apr-1965

## 2017-07-25 ENCOUNTER — Encounter: Payer: Self-pay | Admitting: Physical Therapy

## 2017-07-25 ENCOUNTER — Ambulatory Visit: Payer: BLUE CROSS/BLUE SHIELD | Admitting: Physical Therapy

## 2017-07-25 DIAGNOSIS — M5441 Lumbago with sciatica, right side: Secondary | ICD-10-CM | POA: Diagnosis not present

## 2017-07-25 DIAGNOSIS — M62830 Muscle spasm of back: Secondary | ICD-10-CM

## 2017-07-25 NOTE — Therapy (Addendum)
[place], [place], [address] Phone: [phone]   Fax:  [phone]  Physical Therapy Treatment  Patient Details  Name: Marquis Down MRN: [id number] Date of Birth: August 22, 1965 Referring Provider: Durward Fortes   Encounter Date: 07/25/2017  PT End of Session - 07/25/17 0928    Visit Number  4    Date for PT Re-Evaluation  09/15/17    PT Start Time  0851    PT Stop Time  0930    PT Time Calculation (min)  39 min       Past Medical History:  Diagnosis Date  . Arthritis   . Hypertension     Past Surgical History:  Procedure Laterality Date  . TOTAL HIP ARTHROPLASTY Right 10/23/2016   Procedure: RIGHT TOTAL HIP ARTHROPLASTY;  Surgeon: Garald Balding, MD;  Location: [place];  Service: Orthopedics;  Laterality: Right;  . WISDOM TOOTH EXTRACTION     back tooth a long time ago    There were no vitals filed for this visit.  Subjective Assessment - 07/25/17 0854    Subjective  "I feel better"    Currently in Pain?  Yes    Pain Score  3     Pain Location  Hip    Pain Orientation  Left                       [facility] Adult PT Treatment/Exercise - 07/25/17 0001      Lumbar Exercises: Stretches   Passive Hamstring Stretch  4 reps;10 seconds    Single Knee to Chest Stretch  5 reps;Left;Right;10 seconds    Single Knee to Chest Stretch Limitations  Limit hip flexionto 90* d/t AVN Left hip    Piriformis Stretch  Left;5 reps;10 seconds      Lumbar Exercises: Aerobic   Recumbent Bike  L1 x 12mn      Lumbar Exercises: Machines for Strengthening   Cybex Knee Flexion  25lb 2x10       Lumbar Exercises: Standing   Row  15 reps;Theraband;Both x2    Theraband Level (Row)  Level 3 (Green)    Shoulder Extension  Strengthening;15 reps;Theraband;Both x2    Theraband Level (Shoulder Extension)  Level 3 (Green)    Other Standing Lumbar Exercises  resisted side step 30lb x5 each                PT Short Term Goals - 07/16/17 0835      PT SHORT TERM GOAL #1   Title  independent with initial HEP    Time  2    Period  Weeks    Status  New        PT Long Term Goals - 07/16/17 06438     PT LONG TERM GOAL #1   Title  decrease pain 50%    Time  8    Period  Weeks    Status  New      PT LONG TERM GOAL #2   Title  increase lumbar ROM 25%    Time  8    Period  Weeks    Status  New      PT LONG TERM GOAL #3   Title  understand posture and body mechanics    Time  8    Period  Weeks    Status  New      PT LONG TERM GOAL #4  Title  increasre SLR to 70 degrees    Time  8    Period  Weeks    Status  New            Plan - 07/25/17 0962    Clinical Impression Statement  Pt ~ 6 minutes late for today's treatment session, Reports improvement overall, All exercises completed well. Postural cues need with standing rows and extensions. Some instability and weakness with resisted side steps. Returns to MD tomorrow.    Clinical Impairments Affecting Rehab Potential  AVN in the left hip and recent right THR    PT Frequency  2x / week    PT Duration  8 weeks    PT Treatment/Interventions  ADLs/Self Care Home Management;Cryotherapy;Electrical Stimulation;Traction;Moist Heat;Therapeutic exercise;Therapeutic activities;Functional mobility training;Patient/family education;Manual techniques    PT Next Visit Plan  slowly start core stability and LE flexibility       Patient will benefit from skilled therapeutic intervention in order to improve the following deficits and impairments:  Abnormal gait, Decreased range of motion, Increased muscle spasms, Pain, Improper body mechanics, Impaired flexibility, Postural dysfunction, Decreased strength  Visit Diagnosis: Muscle spasm of back  Acute bilateral low back pain with right-sided sciatica     Problem List Patient Active Problem List   Diagnosis Date Noted  . Avascular necrosis of hip, right ([place])  10/23/2016  . S/P prosthetic total arthroplasty of the hip 10/23/2016   PHYSICAL THERAPY DISCHARGE SUMMARY  Visits from Start of Care: 4   Plan: Patient agrees to discharge.  Patient goals were not met. Patient is being discharged due to being pleased with the current functional level.  ?????      Scot Jun, PTA 07/25/2017, 9:33 AM  [place], [place], [address] Phone: [phone]   Fax:  [phone]  Name: Worthington Cruzan MRN: [id number] Date of Birth: 1965-05-11

## 2017-08-02 ENCOUNTER — Ambulatory Visit (INDEPENDENT_AMBULATORY_CARE_PROVIDER_SITE_OTHER): Payer: BLUE CROSS/BLUE SHIELD | Admitting: Orthopaedic Surgery

## 2017-08-06 ENCOUNTER — Telehealth (INDEPENDENT_AMBULATORY_CARE_PROVIDER_SITE_OTHER): Payer: Self-pay | Admitting: Orthopaedic Surgery

## 2017-08-06 NOTE — Telephone Encounter (Signed)
Patient called stating that at his last appointment he spoke with Arlys John about getting a letter which states he had a right total hip replacement so that he wouldn't get stopped going through the metal detectors in the airport.  Patient states he is going out of the country this Friday and is requesting to pick up the letter tomorrow.

## 2017-08-06 NOTE — Telephone Encounter (Signed)
Please advise 

## 2017-08-07 NOTE — Telephone Encounter (Signed)
LEFT PT MESSAGE IN REGUARDS TO DR Serita Grit NOTE

## 2017-08-07 NOTE — Telephone Encounter (Signed)
Since 9-1-1 letters will not work.If he activates the detector he will automatically be pulled over to be scanned.  LETTER will not prevent that. Get to the airport early. Please call with this info

## 2017-08-21 ENCOUNTER — Encounter (INDEPENDENT_AMBULATORY_CARE_PROVIDER_SITE_OTHER): Payer: Self-pay | Admitting: Physical Medicine and Rehabilitation

## 2017-08-23 IMAGING — DX DG HIP (WITH OR WITHOUT PELVIS) 1V PORT*R*
2 series · 2 of 2 positions shown · non-contrast
Comparison: 06/18/2016.

CLINICAL DATA: 51-year-old male status post right hip replacement.

EXAM:
DG HIP (WITH OR WITHOUT PELVIS) 1V PORT RIGHT

[pelvis ap]
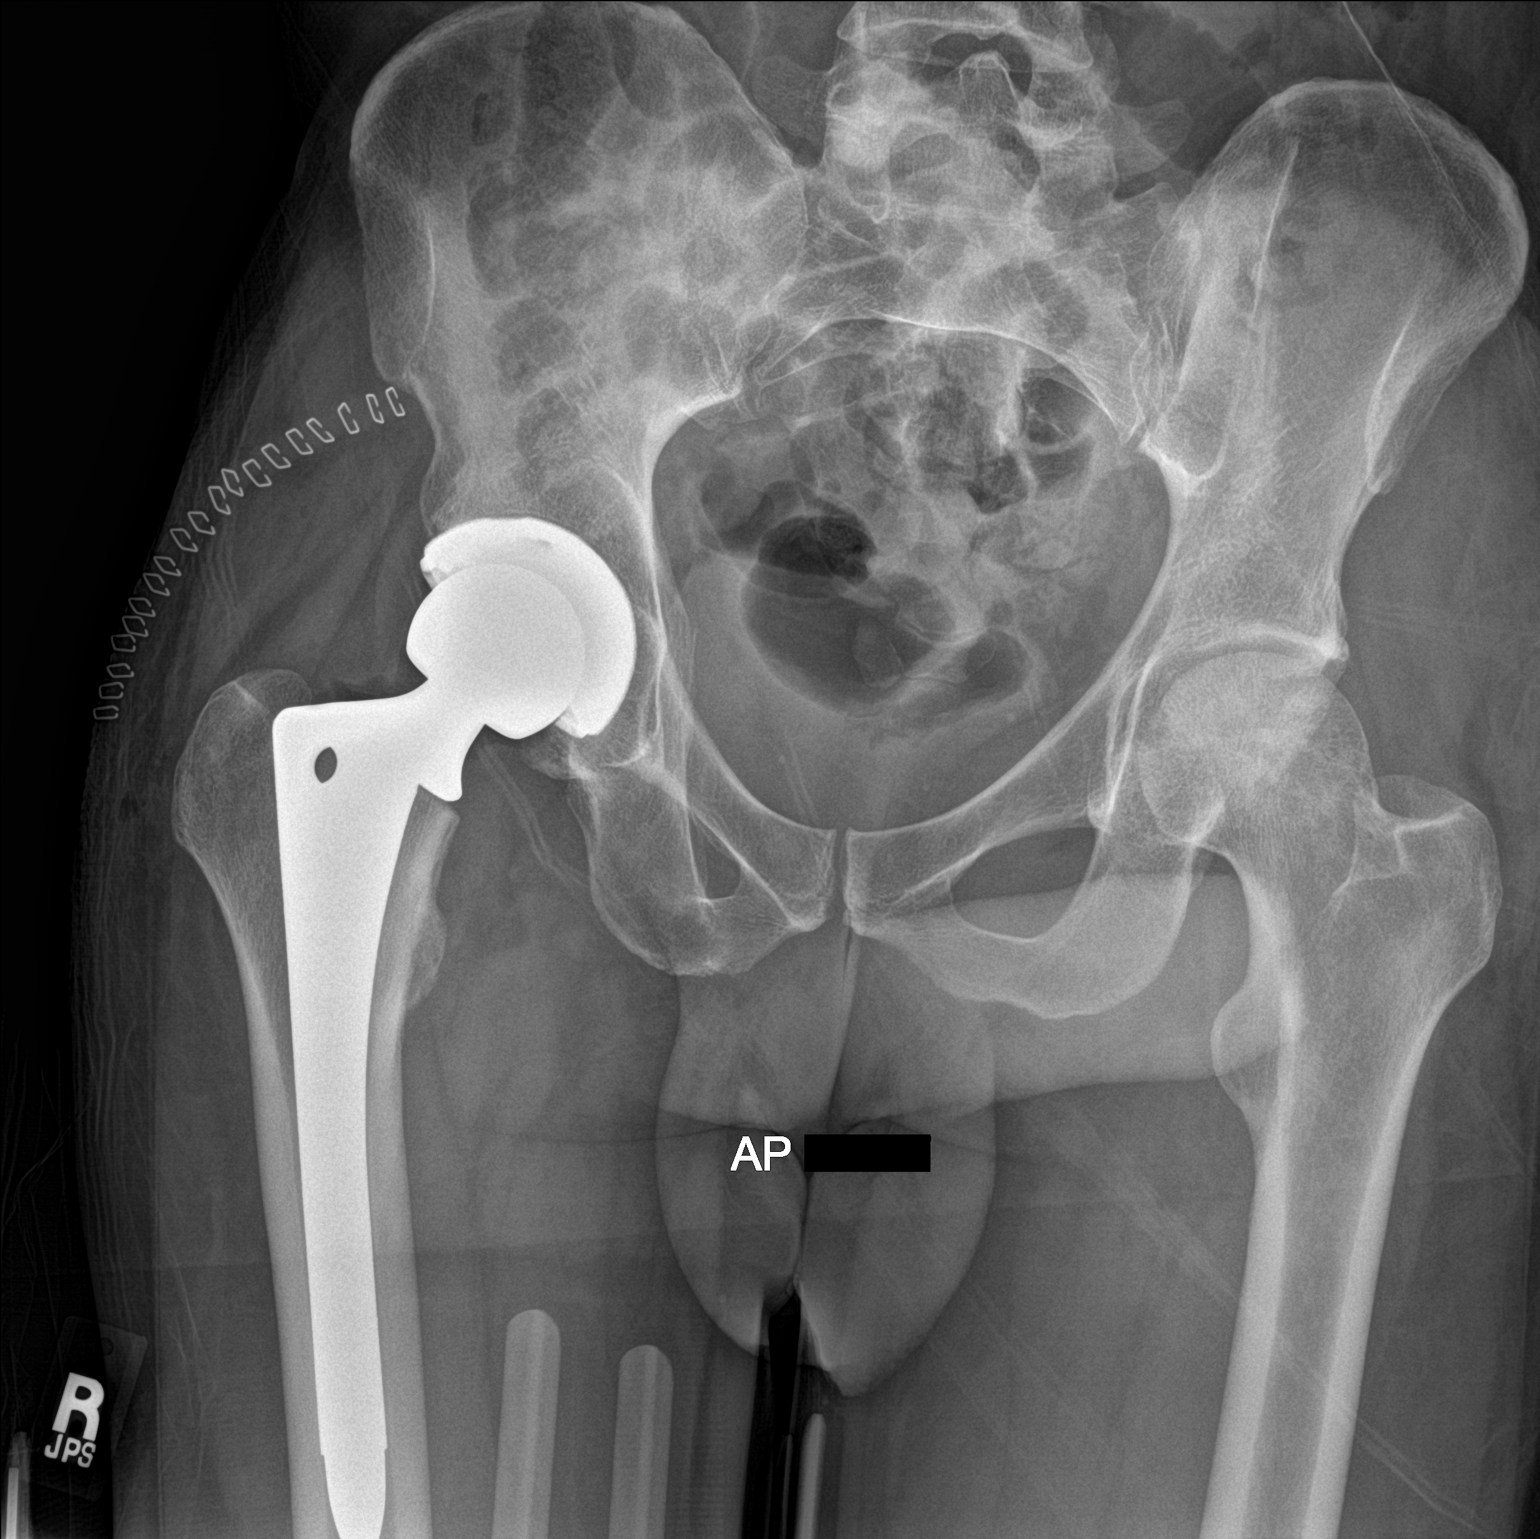

[hip lat]
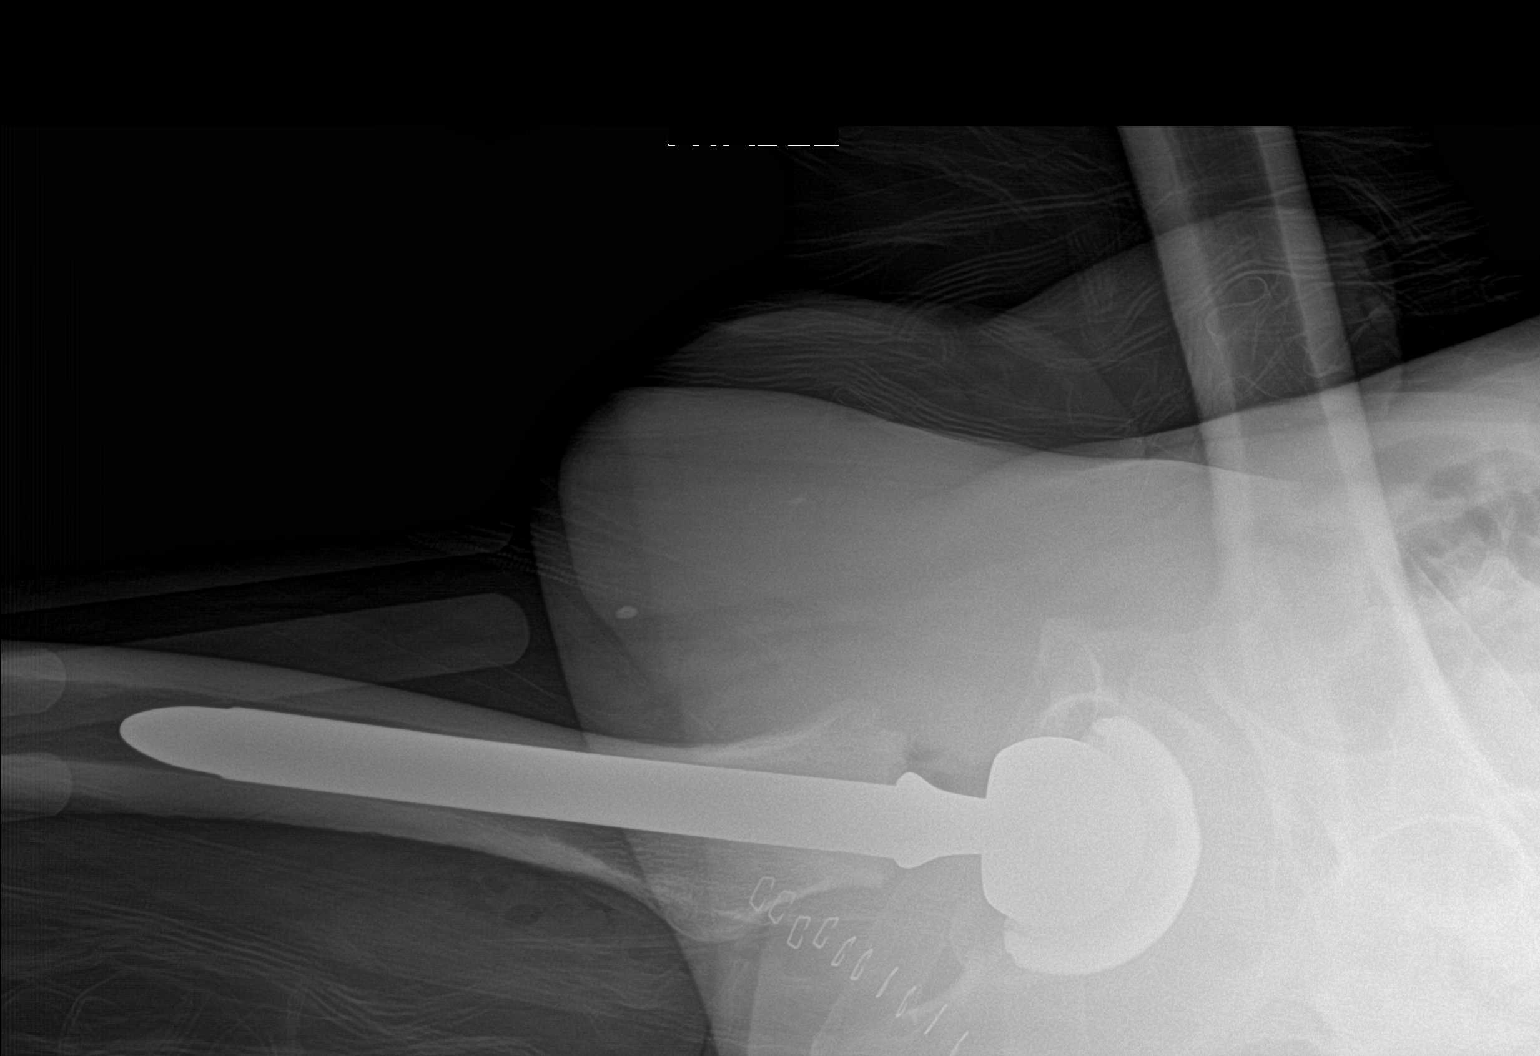

[2 of 2 positions shown; findings below may reference images not displayed]

FINDINGS: Portable AP and cross-table lateral views of the pelvis and right
hip. Bipolar type right hip arthroplasty in place. Hardware appears
intact and normally aligned. No unexpected osseous changes
identified. Overlying postoperative changes to the soft tissues with
skin staples in place. Stable left hip.
IMPRESSION: Right bipolar hip arthroplasty with no adverse features identified.

## 2017-11-11 ENCOUNTER — Other Ambulatory Visit: Payer: Self-pay | Admitting: Medical

## 2018-02-05 ENCOUNTER — Encounter: Payer: Self-pay | Admitting: Medical

## 2018-02-05 ENCOUNTER — Ambulatory Visit (INDEPENDENT_AMBULATORY_CARE_PROVIDER_SITE_OTHER): Payer: BLUE CROSS/BLUE SHIELD | Admitting: Medical

## 2018-02-05 VITALS — BP 120/70 | HR 76 | Temp 98.4°F | Resp 16 | Ht 69.0 in | Wt 143.2 lb

## 2018-02-05 DIAGNOSIS — K047 Periapical abscess without sinus: Secondary | ICD-10-CM

## 2018-02-05 DIAGNOSIS — R6883 Chills (without fever): Secondary | ICD-10-CM

## 2018-02-05 MED ORDER — HYDROCODONE-ACETAMINOPHEN 5-325 MG PO TABS: 1.0000 | ORAL_TABLET | Freq: Four times a day (QID) | ORAL | 0 refills | Status: DC | PRN

## 2018-02-05 MED ORDER — DICLOFENAC SODIUM 75 MG PO TBEC: 75.0000 mg | DELAYED_RELEASE_TABLET | Freq: Two times a day (BID) | ORAL | 0 refills | Status: AC

## 2018-02-05 MED ORDER — CLINDAMYCIN HCL 150 MG PO CAPS: 150.0000 mg | ORAL_CAPSULE | Freq: Three times a day (TID) | ORAL | 0 refills | Status: AC

## 2018-02-05 MED ORDER — CEFTRIAXONE SODIUM 1 G IJ SOLR
1.0000 g | Freq: Once | INTRAMUSCULAR | Status: AC
  Administered 2018-02-05: 1 g via INTRAMUSCULAR

## 2018-02-05 NOTE — Patient Instructions (Addendum)
You do appear to have a dental infection with possible abscess.  We gave you 1 g Rocephin IM injection today and I prescribed clindamycin antibiotic as recent use of penicillin did not appear to be adequate.  For pain inflammation, I prescribed diclofenac.  For moderate to severe pain making her 4 days of Norco available.  I went ahead and put a referral to dental Office today.  I am going to see if staff can get you in with them.  I am also providing name of the dental clinic as well as address and phone number.  You could go there directly.  You might need to take English-speaking friend.   The name and number is [facility]   [address] [phone]  Remember if your signs and symptoms worsen as discussed then recommend ED evaluation as in that event you might need IV antibiotics and possible oral surgeon consultation.  Follow-up with me in 2 to 5 days provided referral to dentist office delayed.  We did eventually get pt appointment today  and spanish speaking staff/Ruth explained appointment time to him and directions etc.

## 2018-02-05 NOTE — Progress Notes (Signed)
Subjective:    Patient ID: Trevor Mitchell, male    DOB: 01/11/1966, 52 y.o.   MRN: [id number]  HPI  Pt in for very swollen rt lower jaw area. Pt had on and off pain and swelling for about one month. Pt can't open up mouth  completely due to pain and swelling.   Pt states was on antibiotic ampicillin 1 tab 3 times a day for 7 days about 12 days ago. Appeared better but then over last 5 days swelling reoccured.   Pt does not have local dentist. Last saw dentist about 1[place]5 years ago [place].  Pt at times felt mild chills. Occasional sweating.      Review of Systems  Constitutional: Positive for chills and diaphoresis.  HENT: Negative for congestion.        Tooth/jaw pain  Respiratory: Negative for cough, chest tightness, shortness of breath and wheezing.   Cardiovascular: Negative for chest pain and palpitations.  Musculoskeletal: Negative for back pain.  Neurological: Negative for dizziness, seizures and headaches.  Hematological: Negative for adenopathy. Does not bruise/bleed easily.  Psychiatric/Behavioral: Negative for behavioral problems and confusion.    Past Medical History:  Diagnosis Date  . Arthritis   . Hypertension      Social History   Socioeconomic History  . Marital status: Married    Spouse name: Not on file  . Number of children: Not on file  . Years of education: Not on file  . Highest education level: Not on file  Occupational History  . Not on file  Social Needs  . Financial resource strain: Not on file  . Food insecurity:    Worry: Not on file    Inability: Not on file  . Transportation needs:    Medical: Not on file    Non-medical: Not on file  Tobacco Use  . Smoking status: Current Every Day Smoker    Packs/day: 0.50    Years: 35.00    Pack years: 17.50    Types: Cigarettes  . Smokeless tobacco: Never Used  . Tobacco comment: 8-10 cigarettes daily  Substance and Sexual Activity  . Alcohol use: Yes   Comment: 1 liter per month / 48 oz beer every 2 weeks.  . Drug use: No    Comment: previous cocaine user long time ago  . Sexual activity: Yes  Lifestyle  . Physical activity:    Days per week: Not on file    Minutes per session: Not on file  . Stress: Not on file  Relationships  . Social connections:    Talks on phone: Not on file    Gets together: Not on file    Attends religious service: Not on file    Active member of club or organization: Not on file    Attends meetings of clubs or organizations: Not on file    Relationship status: Not on file  . Intimate partner violence:    Fear of current or ex partner: Not on file    Emotionally abused: Not on file    Physically abused: Not on file    Forced sexual activity: Not on file  Other Topics Concern  . Not on file  Social History Narrative  . Not on file    Past Surgical History:  Procedure Laterality Date  . TOTAL HIP ARTHROPLASTY Right 10/23/2016   Procedure: RIGHT TOTAL HIP ARTHROPLASTY;  Surgeon: Valeria BatmanWhitfield, Peter W, MD;  [facility]: [facility];  Service: Orthopedics;  Laterality: Right;  .  WISDOM TOOTH EXTRACTION     back tooth a long time ago    Family History  Problem Relation Age of Onset  . High blood pressure Mother   . Diabetes Mother   . Asthma Sister   . Hypertension Sister   . Other Maternal Uncle        enlarged heart    Allergies  Allergen Reactions  . No Known Allergies     Current Outpatient Medications on File Prior to Visit  Medication Sig Dispense Refill  . ampicillin (PRINCIPEN) 500 MG capsule Take 500 mg by mouth 4 (four) times daily.    Marland Kitchen losartan (COZAAR) 100 MG tablet TAKE 1 TABLET BY MOUTH ONCE DAILY 90 tablet 0  . methocarbamol (ROBAXIN) 500 MG tablet I po bid prn 30 tablet 0  . traMADol (ULTRAM) 50 MG tablet 1 tab po bid as needed for pain 10 tablet 0   No current facility-administered medications on file prior to visit.     BP 120/70   Pulse 76   Temp 98.4 F (36.9 C) (Oral)   Resp  16   Ht 5\' 9"  (1.753 m)   Wt 143 lb 3.2 oz (65 kg)   SpO2 98%   BMI 21.15 kg/m       Objective:   Physical Exam  General- No acute distress. Pleasant patient. Neck- Full range of motion, no jvd.no obvious swollen submandibular lymph. No neck stiffenss Lungs- Clear, even and unlabored. Heart- regular rate and rhythm. Neurologic- CNII- XII grossly intact.  Mouth- inside of mouth. Swollen buccal mucosa adjacent to molar rt back side. Whitish coloration to mucuosa. Tooth partially covered due to severe inflmation. Rt side jaw- angle of mandible over parotid area very swollen.         Assessment & Plan:  You do appear to have a dental infection with possible abscess.  We gave you 1 g Rocephin IM injection today and I prescribed clindamycin antibiotic as recent use of penicillin did not appear to be adequate.  For pain inflammation, I prescribed diclofenac.  For moderate to severe pain making her 4 days of Norco available.  I went ahead and put a referral to dental Office today.  I am going to see if staff can get you in with them.  I am also providing name of the dental clinic as well as address and phone number.  You could go there directly.  You might need to take English-speaking friend.   The name and number is [facility]   [address] [phone]  Remember if your signs and symptoms worsen as discussed then recommend ED evaluation as in that event you might need IV antibiotics and possible oral surgeon consultation.  Follow-up with me in 2 to 5 days provided referral to dentist office delayed.   Esperanza Richters, PA-C

## 2018-02-10 ENCOUNTER — Telehealth (INDEPENDENT_AMBULATORY_CARE_PROVIDER_SITE_OTHER): Payer: Self-pay | Admitting: Radiology

## 2018-02-10 NOTE — Telephone Encounter (Signed)
[facility] from [facility] [place], [facility], [facility] in [facility] to premedication for a routine dental cleaning since patient has had a hip replacement.  Per Dr. Cleophas DunkerWhitfield, Amoxicillin 500mg  Take 1000mg  1 hour prior to procedure then take 1000mg  6 hours after procedure. Faxed back to dental office.

## 2018-02-11 ENCOUNTER — Other Ambulatory Visit: Payer: Self-pay | Admitting: *Deleted

## 2018-02-11 MED ORDER — AMOXICILLIN 500 MG PO CAPS: ORAL_CAPSULE | ORAL | 0 refills | Status: DC

## 2018-02-11 NOTE — Progress Notes (Signed)
amox

## 2018-03-27 ENCOUNTER — Other Ambulatory Visit: Payer: Self-pay | Admitting: Medical

## 2018-04-11 ENCOUNTER — Ambulatory Visit: Payer: Medicaid Other | Admitting: Medical

## 2018-04-11 ENCOUNTER — Encounter: Payer: Self-pay | Admitting: Medical

## 2018-04-11 VITALS — BP 162/90 | HR 63 | Temp 97.9°F | Resp 16 | Ht 69.0 in | Wt 149.8 lb

## 2018-04-11 DIAGNOSIS — M25552 Pain in left hip: Secondary | ICD-10-CM

## 2018-04-11 DIAGNOSIS — J320 Chronic maxillary sinusitis: Secondary | ICD-10-CM

## 2018-04-11 DIAGNOSIS — I10 Essential (primary) hypertension: Secondary | ICD-10-CM

## 2018-04-11 DIAGNOSIS — Z79899 Other long term (current) drug therapy: Secondary | ICD-10-CM

## 2018-04-11 DIAGNOSIS — L723 Sebaceous cyst: Secondary | ICD-10-CM

## 2018-04-11 DIAGNOSIS — L089 Local infection of the skin and subcutaneous tissue, unspecified: Secondary | ICD-10-CM | POA: Diagnosis not present

## 2018-04-11 DIAGNOSIS — R0981 Nasal congestion: Secondary | ICD-10-CM | POA: Diagnosis not present

## 2018-04-11 LAB — COMPREHENSIVE METABOLIC PANEL
ALBUMIN: 4.8 g/dL (ref 3.5–5.2)
ALT: 32 U/L (ref 0–53)
AST: 27 U/L (ref 0–37)
Alkaline Phosphatase: 59 U/L (ref 39–117)
BUN: 20 mg/dL (ref 6–23)
CHLORIDE: 106 meq/L (ref 96–112)
CO2: 27 mEq/L (ref 19–32)
CREATININE: 1.02 mg/dL (ref 0.40–1.50)
Calcium: 9.7 mg/dL (ref 8.4–10.5)
GFR: 76.55 mL/min (ref >60.00)
GLUCOSE: 89 mg/dL (ref 70–99)
POTASSIUM: 4 meq/L (ref 3.5–5.1)
SODIUM: 141 meq/L (ref 135–145)
TOTAL PROTEIN: 7.3 g/dL (ref 6.0–8.3)
Total Bilirubin: 0.5 mg/dL (ref 0.2–1.2)

## 2018-04-11 MED ORDER — MUPIROCIN 2 % EX OINT: 1.0000 "application " | TOPICAL_OINTMENT | Freq: Two times a day (BID) | CUTANEOUS | 0 refills | Status: AC

## 2018-04-11 MED ORDER — TRAMADOL HCL 50 MG PO TABS: ORAL_TABLET | ORAL | 0 refills | Status: DC

## 2018-04-11 MED ORDER — DOXYCYCLINE HYCLATE 100 MG PO TABS: 100.0000 mg | ORAL_TABLET | Freq: Two times a day (BID) | ORAL | 0 refills | Status: AC

## 2018-04-11 MED ORDER — HYDROCHLOROTHIAZIDE 12.5 MG PO CAPS: 12.5000 mg | ORAL_CAPSULE | Freq: Every day | ORAL | 3 refills | Status: DC

## 2018-04-11 MED ORDER — LOSARTAN POTASSIUM 100 MG PO TABS: 100.0000 mg | ORAL_TABLET | Freq: Every day | ORAL | 11 refills | Status: AC

## 2018-04-11 NOTE — Progress Notes (Signed)
Subjective:    Patient ID: Trevor Mitchell, male    DOB: 1965-12-03, 53 y.o.   MRN: [id number]  HPI  Pt in states had positive nasal culture from nose before surgery for his hip. Pt states about 6 weeks ago he had some redness near lateral chest/adjacent to nipple. Also had small break in the skin. He had serosanguinous with yellow color dc to area. Pt had some left over antbiotics left over from tooth infection. He states was pcn based antibiotic. He has some faint pain in same area and still feels little hard.  Pt states describes chronic frontal and sinus area pain. He states he has chronic bad/foul type smell from his nose.  Pt also states small bump on his rt scrotum. He states very small lump present for 3 years. He states growing very slowly. His dad had similar.  Pt still has some chronic left hip pain. Rt hip pain and had surgery/hip replacement. He still has not had left side replacement. Pt is holding off and does not want to do surgery for left hip yet. He did well in past with just tramadol bid.    Review of Systems  Constitutional: Negative for chills, fatigue and fever.  HENT: Positive for congestion, sinus pressure and sinus pain. Negative for sore throat.   Respiratory: Negative for cough, chest tightness, shortness of breath and wheezing.   Cardiovascular: Negative for chest pain and palpitations.  Musculoskeletal: Negative for back pain.       Left hip pain chronic daily. Moderate-severe.  Skin:       Faint induarted area left chest.  Neurological: Negative for dizziness, speech difficulty, weakness and headaches.  Hematological: Negative for adenopathy. Does not bruise/bleed easily.  Psychiatric/Behavioral: Negative for behavioral problems, confusion and sleep disturbance. The patient is not nervous/anxious.     Past Medical History:  Diagnosis Date  . Arthritis   . Hypertension      Social History   Socioeconomic History  . Marital status:  Married    Spouse name: Not on file  . Number of children: Not on file  . Years of education: Not on file  . Highest education level: Not on file  Occupational History  . Not on file  Social Needs  . Financial resource strain: Not on file  . Food insecurity:    Worry: Not on file    Inability: Not on file  . Transportation needs:    Medical: Not on file    Non-medical: Not on file  Tobacco Use  . Smoking status: Current Every Day Smoker    Packs/day: 0.50    Years: 35.00    Pack years: 17.50    Types: Cigarettes  . Smokeless tobacco: Never Used  . Tobacco comment: 8-10 cigarettes daily  Substance and Sexual Activity  . Alcohol use: Yes    Comment: 1 liter per month / 48 oz beer every 2 weeks.  . Drug use: No    Comment: previous cocaine user long time ago  . Sexual activity: Yes  Lifestyle  . Physical activity:    Days per week: Not on file    Minutes per session: Not on file  . Stress: Not on file  Relationships  . Social connections:    Talks on phone: Not on file    Gets together: Not on file    Attends religious service: Not on file    Active member of club or organization: Not on file  Attends meetings of clubs or organizations: Not on file    Relationship status: Not on file  . Intimate partner violence:    Fear of current or ex partner: Not on file    Emotionally abused: Not on file    Physically abused: Not on file    Forced sexual activity: Not on file  Other Topics Concern  . Not on file  Social History Narrative  . Not on file    Past Surgical History:  Procedure Laterality Date  . TOTAL HIP ARTHROPLASTY Right 10/23/2016   Procedure: RIGHT TOTAL HIP ARTHROPLASTY;  Surgeon: Valeria Batman, MD;  Location: [facility] OR;  Service: Orthopedics;  Laterality: Right;  . WISDOM TOOTH EXTRACTION     back tooth a long time ago    Family History  Problem Relation Age of Onset  . High blood pressure Mother   . Diabetes Mother   . Asthma Sister   .  Hypertension Sister   . Other Maternal Uncle        enlarged heart    Allergies  Allergen Reactions  . No Known Allergies     Current Outpatient Medications on File Prior to Visit  Medication Sig Dispense Refill  . clindamycin (CLEOCIN) 150 MG capsule Take 1 capsule (150 mg total) by mouth 3 (three) times daily. 30 capsule 0  . diclofenac (VOLTAREN) 75 MG EC tablet Take 1 tablet (75 mg total) by mouth 2 (two) times daily. 30 tablet 0  . losartan (COZAAR) 100 MG tablet TAKE 1 TABLET BY MOUTH ONCE DAILY 90 tablet 0  . methocarbamol (ROBAXIN) 500 MG tablet I po bid prn 30 tablet 0   No current facility-administered medications on file prior to visit.     BP (!) 162/90   Pulse 63   Temp 97.9 F (36.6 C) (Oral)   Resp 16   Ht 5\' 9"  (1.753 m)   Wt 149 lb 12.8 oz (67.9 kg)   SpO2 100%   BMI 22.12 kg/m       Objective:   Physical Exam  General  Mental Status - Alert. General Appearance - Well groomed. Not in acute distress.  Skin Left pec. Above and adjacent to nipple indurated. Faint tenderness. No redness.  HEENT Head- Normal. Ear Auditory Canal - Left- Normal. Right - Normal.Tympanic Membrane- Left- Normal. Right- Normal. Eye Sclera/Conjunctiva- Left- Normal. Right- Normal. Nose & Sinuses Nasal Mucosa- Left-  Boggy and Congested. Right-  Boggy and  Congested.Bilateral fain maxillary and faintfrontal sinus pressure. Boggy turbinates. Left nares mild tender and swollen.but not red. Mouth & Throat Lips: Upper Lip- Normal: no dryness, cracking, pallor, cyanosis, or vesicular eruption. Lower Lip-Normal: no dryness, cracking, pallor, cyanosis or vesicular eruption. Buccal Mucosa- Bilateral- No Aphthous ulcers. Oropharynx- No Discharge or Erythema. Tonsils: Characteristics- Bilateral- No Erythema or Congestion. Size/Enlargement- Bilateral- No enlargement. Discharge- bilateral-None.  Neck Neck- Supple. No Masses.   Chest and Lung Exam Auscultation: Breath  Sounds:-Clear even and unlabored.  Cardiovascular Auscultation:Rythm- Regular, rate and rhythm. Murmurs & Other Heart Sounds:Ausculatation of the heart reveal- No Murmurs.  Lymphatic Head & Neck General Head & Neck Lymphatics: Bilateral: Description- No Localized lymphadenopathy.  Left hip- pain on rom.  Scrotum- rt side 6 mm sized problabe sebacious cyst.        Assessment & Plan:  You do describe a probable left-sided pectoralis region skin infection.  Sounds like you may have had a small abscess.  With your history of MRSA that grew out from nose  culture, I do think that is likely the bacteria that caused the prior infection 6 weeks ago.  Presently you have small area of induration and slight pain.  Probable persisting low-level infection.  I am going to prescribe you doxycycline antibiotic which has MRSA coverage.  You do also have some chronic nasal congestion with some sinus pressure presently.  Possible sinus infection and doxycycline has good coverage for the sinuses as well.  We will also prescribe you Flonase nasal spray to help with the congestion.  You do appear to have a probable sebaceous cyst on your scrotum.  Would recommend warm compresses twice daily to the area.  Will see if size decreases a little bit with use of doxycycline.  Since you have had this chronically we will go ahead and make referral to urologist.  You do have chronic left hip pain but not severe as you are previous right hip pain which required hip replacement.  Tramadol has helped you in the past and will go ahead and prescribe you this again.  Will prescribe twice daily dosing.  You signed contract today and will give a UDS.  Patient also mentioned that his blood pressure is been elevated consistently in the 160/90 range.  So I did advise him to continue losartan and added HCTZ.  Follow-up in 10 to 14 days or as needed.  40 minutes spent with patient.  50% time counseling patient on plan going  forward on various conditions.  Esperanza RichtersEdward Ahkeem Goede, PA-C

## 2018-04-11 NOTE — Patient Instructions (Addendum)
You do describe a probable left-sided pectoralis region skin infection.  Sounds like you may have had a small abscess.  With your history of MRSA that grew out from nose culture, I do think that is likely the bacteria that caused the prior infection 6 weeks ago.  Presently you have small area of induration and slight pain.  Probable persisting low-level infection.  I am going to prescribe you doxycycline antibiotic which has MRSA coverage.  You do also have some chronic nasal congestion with some sinus pressure presently.  Possible sinus infection and doxycycline has good coverage for the sinuses as well.  We will also prescribe you Flonase nasal spray to help with the congestion.  You do appear to have a probable sebaceous cyst on your scrotum.  Would recommend warm compresses twice daily to the area.  Will see if size decreases a little bit with use of doxycycline.  Since you have had this chronically we will go ahead and make referral to urologist.  You do have chronic left hip pain but not severe as you are previous right hip pain which required hip replacement.  Tramadol has helped you in the past and will go ahead and prescribe you this again.  Will prescribe twice daily dosing.  You signed contract today and will give a UDS.  Patient also mentioned that his blood pressure is been elevated consistently in the 160/90 range.  So I did advise him to continue losartan and added HCTZ.  Follow-up in 10 to 14 days or as needed.

## 2018-04-12 LAB — PAIN MGMT, PROFILE 8 W/CONF, U
6 Acetylmorphine: NEGATIVE ng/mL (ref <10)
ALCOHOL METABOLITES: NEGATIVE ng/mL (ref <500)
AMPHETAMINES: NEGATIVE ng/mL (ref <500)
Benzodiazepines: NEGATIVE ng/mL (ref <100)
Buprenorphine, Urine: NEGATIVE ng/mL (ref <5)
CREATININE: 126.2 mg/dL
Cocaine Metabolite: NEGATIVE ng/mL (ref <150)
MARIJUANA METABOLITE: NEGATIVE ng/mL (ref <20)
MDMA: NEGATIVE ng/mL (ref <500)
Opiates: NEGATIVE ng/mL (ref <100)
Oxidant: NEGATIVE ug/mL (ref <200)
Oxycodone: NEGATIVE ng/mL (ref <100)
pH: 5.52 (ref 4.5–9.0)

## 2018-04-18 ENCOUNTER — Telehealth: Payer: Self-pay | Admitting: Medical

## 2018-04-18 ENCOUNTER — Other Ambulatory Visit: Payer: Self-pay | Admitting: Medical

## 2018-04-18 MED ORDER — TRAMADOL HCL 50 MG PO TABS: ORAL_TABLET | ORAL | 0 refills | Status: DC

## 2018-04-18 NOTE — Telephone Encounter (Signed)
Copied from CRM [phone]. Topic: Quick Communication - See Telephone Encounter >> Apr 18, 2018  3:17 PM Angela NevinWilliams, Candice N wrote: CRM for notification. See Telephone encounter for: 04/18/18.  Patient states that pharmacy never received refill for traMADol (ULTRAM) 50 MG tablet sent on 01/24. Patient is requesting this be resent, as soon as possible. Please advise.

## 2018-04-18 NOTE — Telephone Encounter (Signed)
It looks like I printed the prescription and gave the prescription to him on his day of visit.  So he may have lost the prescription or it is possible I did not give him the prescription??  I do not think that I would have forgotten to give him the paper prescription.  I did check the controlled medication website and new tramadol rx did not sow on his profile.  I sent new prescription of tramadol electronically to his pharmacy today.  I left a message explained to patient that if by chance he finds the print prescription  Then shred and put in the trash can.  We will you notify patient on Monday.  I got a chance to review messages after hours on Friday so too late to call him today.  But did send the prescription to the pharmacy.

## 2018-04-21 ENCOUNTER — Telehealth: Payer: Self-pay

## 2018-04-21 DIAGNOSIS — Z0279 Encounter for issue of other medical certificate: Secondary | ICD-10-CM

## 2018-04-21 NOTE — Telephone Encounter (Signed)
PA form completed and faxed to [facility] at [phone]. Will call [facility] in 24 hours to check status- [phone].

## 2018-04-21 NOTE — Telephone Encounter (Signed)
Talked to patient's wife and advised her of new rx and if they find another rx at home this needs to be shred.

## 2018-04-22 NOTE — Telephone Encounter (Signed)
Called [facility] at [phone]- spoke w/ [place], [place] approved, effective 04/21/2018 to 10/18/2018. Reference number: [id number].

## 2018-04-28 NOTE — Telephone Encounter (Signed)
See below. Already approved.

## 2018-04-28 NOTE — Telephone Encounter (Signed)
Pt checking status. [phone]

## 2018-05-02 ENCOUNTER — Ambulatory Visit: Payer: Medicaid Other | Admitting: Medical

## 2018-05-05 ENCOUNTER — Other Ambulatory Visit: Payer: Self-pay | Admitting: Medical

## 2018-05-05 NOTE — Telephone Encounter (Signed)
There needs to be major clarification about pt tramadol. He keeps asking for rx. On date of service with me I gave print rx and he did not fill per controlled med site. He called back later asking from script. I think he must have lost.    I resent rx electronically to [facility]on 04/18/2018. He states I need to send it again.  I can't send this a third time yet without knowing exactly what is going on. Did [facility] go to [facility] to try to fill. Will you [facility] and see if prescription pending.   This is controlled med so just can't keep issuing prescriptions?   Spanish speaker so will explain situation and get clarification from him.

## 2018-05-05 NOTE — Telephone Encounter (Signed)
Copied from CRM [id number]. Topic: Quick Communication - Rx Refill/Question >> May 05, 2018  4:53 PM Baldo Daub L wrote: Medication: traMADol (ULTRAM) 50 MG tablet  Has the patient contacted their pharmacy? Yes - states there is nothing there. (Agent: If no, request that the patient contact the pharmacy for the refill.) (Agent: If yes, when and what did the pharmacy advise?)  Preferred Pharmacy (with phone number or street name): [facility] [address] [place], [place] - [address] [phone] (Phone) [phone] (Fax)  Agent: Please be advised that RX refills may take up to 3 business days. We ask that you follow-up with your pharmacy.

## 2018-05-06 NOTE — Telephone Encounter (Signed)
Per [facility] pharmacist, they did received the 30 days supply rx but they can only do a 5 days supply (10 tabs) for an initial rx of tramadol. Verbal rx was given for this. The pharmacist at [facility] said after this 5 days supply patient can get a 30 days supply. They will need a new rx.

## 2018-05-07 NOTE — Telephone Encounter (Signed)
Now will wait 5 days until he runs out of current rx of tramaol. Then can issue 60 tab rx of tramadol. But will need to call pharmacy directly and make sure the last 60 tab rx is not active.

## 2018-05-08 NOTE — Telephone Encounter (Signed)
Last rx for 60 tabs was cancelled per pharmacist we can just send the new one after the 5 days.

## 2018-05-11 NOTE — Telephone Encounter (Signed)
Will you call pt pharmacy [facility] and make sure he picked up tramadol 5 day rx and that it has been 5 days since he picked that presciption up. Then I can send in new 60 tab day rx.  Thanks, Esperanza Richters, PA-C

## 2018-05-11 NOTE — Addendum Note (Signed)
Addended by: Gwenevere Abbot on: 05/11/2018 10:29 AM   Modules accepted: Orders

## 2018-05-16 ENCOUNTER — Ambulatory Visit: Payer: Medicaid Other | Admitting: Medical

## 2018-09-08 ENCOUNTER — Other Ambulatory Visit: Payer: Self-pay | Admitting: Medical
